# Patient Record
Sex: Male | Born: 1975 | Race: Black or African American | Hispanic: No | Marital: Single | Smoking: Current every day smoker
Health system: Southern US, Community
[De-identification: ages and names within clinical notes are randomized; demographics above are authoritative.]

## PROBLEM LIST (undated history)

## (undated) DIAGNOSIS — IMO0001 Reserved for inherently not codable concepts without codable children: Secondary | ICD-10-CM

## (undated) DIAGNOSIS — E785 Hyperlipidemia, unspecified: Secondary | ICD-10-CM

## (undated) DIAGNOSIS — K219 Gastro-esophageal reflux disease without esophagitis: Secondary | ICD-10-CM

## (undated) DIAGNOSIS — E119 Type 2 diabetes mellitus without complications: Secondary | ICD-10-CM

## (undated) DIAGNOSIS — I10 Essential (primary) hypertension: Secondary | ICD-10-CM

---

## 2009-02-23 HISTORY — PX: WISDOM TOOTH EXTRACTION: SHX21

## 2012-02-08 ENCOUNTER — Emergency Department (HOSPITAL_COMMUNITY)
Admission: EM | Admit: 2012-02-08 | Discharge: 2012-02-08 | Disposition: A | Discharge status: Home / Self Care | Payer: Self-pay | Attending: Emergency Medicine | Admitting: Emergency Medicine

## 2012-02-08 ENCOUNTER — Encounter (HOSPITAL_COMMUNITY): Payer: Self-pay | Admitting: *Deleted

## 2012-02-08 ENCOUNTER — Emergency Department (HOSPITAL_COMMUNITY): Payer: Self-pay

## 2012-02-08 DIAGNOSIS — J40 Bronchitis, not specified as acute or chronic: Secondary | ICD-10-CM | POA: Insufficient documentation

## 2012-02-08 DIAGNOSIS — J3489 Other specified disorders of nose and nasal sinuses: Secondary | ICD-10-CM | POA: Insufficient documentation

## 2012-02-08 DIAGNOSIS — R6883 Chills (without fever): Secondary | ICD-10-CM | POA: Insufficient documentation

## 2012-02-08 DIAGNOSIS — J069 Acute upper respiratory infection, unspecified: Secondary | ICD-10-CM | POA: Insufficient documentation

## 2012-02-08 DIAGNOSIS — IMO0001 Reserved for inherently not codable concepts without codable children: Secondary | ICD-10-CM | POA: Insufficient documentation

## 2012-02-08 DIAGNOSIS — K219 Gastro-esophageal reflux disease without esophagitis: Secondary | ICD-10-CM | POA: Insufficient documentation

## 2012-02-08 DIAGNOSIS — R079 Chest pain, unspecified: Secondary | ICD-10-CM | POA: Insufficient documentation

## 2012-02-08 DIAGNOSIS — F17200 Nicotine dependence, unspecified, uncomplicated: Secondary | ICD-10-CM | POA: Insufficient documentation

## 2012-02-08 HISTORY — DX: Reserved for inherently not codable concepts without codable children: IMO0001

## 2012-02-08 HISTORY — DX: Gastro-esophageal reflux disease without esophagitis: K21.9

## 2012-02-08 MED ORDER — PSEUDOEPHEDRINE HCL 60 MG PO TABS: ORAL_TABLET | ORAL | Status: DC

## 2012-02-08 MED ORDER — PROMETHAZINE-CODEINE 6.25-10 MG/5ML PO SYRP: 5.0000 mL | ORAL_SOLUTION | Freq: Four times a day (QID) | ORAL | Status: AC | PRN

## 2012-02-08 MED ORDER — DICLOFENAC SODIUM 75 MG PO TBEC: 75.0000 mg | DELAYED_RELEASE_TABLET | Freq: Two times a day (BID) | ORAL | Status: DC

## 2012-02-08 NOTE — ED Notes (Addendum)
Pt with chest pain only with coughing, and body aches since yesterday morning

## 2012-02-08 NOTE — ED Provider Notes (Signed)
History     CSN: [id number]  Arrival date & time 02/08/12  1648   First MD Initiated Contact with Patient 02/08/12 1716      Chief Complaint  Patient presents with  . Chest Pain  . Generalized Body Aches  . Cough    (Consider location/radiation/quality/duration/timing/severity/associated sxs/prior treatment) Patient is a 36 y.o. male presenting with cough. The history is provided by the patient.  Cough This is a new problem. The current episode started yesterday. The problem occurs constantly. The problem has been gradually worsening. The cough is non-productive. The maximum temperature recorded prior to his arrival was 100 to 100.9 F. Associated symptoms include chest pain, chills, headaches, rhinorrhea and myalgias. Pertinent negatives include no shortness of breath and no wheezing. He has tried nothing for the symptoms. He is a smoker. His past medical history is significant for bronchitis.    Past Medical History  Diagnosis Date  . Reflux     History reviewed. No pertinent past surgical history.  History reviewed. No pertinent family history.  History  Substance Use Topics  . Smoking status: Current Every Day Smoker -- 0.5 packs/day    Types: Cigarettes  . Smokeless tobacco: Not on file  . Alcohol Use: Yes      Review of Systems  Constitutional: Positive for chills. Negative for activity change.       All ROS Neg except as noted in HPI  HENT: Positive for rhinorrhea. Negative for nosebleeds and neck pain.   Eyes: Negative for photophobia and discharge.  Respiratory: Positive for cough. Negative for shortness of breath and wheezing.   Cardiovascular: Positive for chest pain. Negative for palpitations.  Gastrointestinal: Negative for abdominal pain and blood in stool.  Genitourinary: Negative for dysuria, frequency and hematuria.  Musculoskeletal: Positive for myalgias. Negative for back pain and arthralgias.  Skin: Negative.   Neurological: Positive for  headaches. Negative for dizziness, seizures and speech difficulty.  Psychiatric/Behavioral: Negative for hallucinations and confusion.    Allergies  Review of patient's allergies indicates no known allergies.  Home Medications  No current outpatient prescriptions on file.  BP 132/77  Pulse 112  Temp 100.3 F (37.9 C) (Oral)  Resp 20  Ht 5\' 8"  (1.727 m)  Wt 205 lb (92.987 kg)  BMI 31.17 kg/m2  SpO2 100%  Physical Exam  Nursing note and vitals reviewed. Constitutional: He is oriented to person, place, and time. He appears well-developed and well-nourished.  Non-toxic appearance.  HENT:  Head: Normocephalic.  Right Ear: Tympanic membrane and external ear normal.  Left Ear: Tympanic membrane and external ear normal.       Nasal congestion present  Eyes: EOM and lids are normal. Pupils are equal, round, and reactive to light.  Neck: Normal range of motion. Neck supple. Carotid bruit is not present.  Cardiovascular: Normal rate, regular rhythm, normal heart sounds, intact distal pulses and normal pulses.   Pulmonary/Chest: No respiratory distress. He exhibits tenderness.       There is symmetrical rise and fall of the chest. The patient has pain with deep breathing. Coarse breath sounds present, but no focal consolidation on limited exam.  Abdominal: Soft. Bowel sounds are normal. There is no tenderness. There is no guarding.  Musculoskeletal: Normal range of motion.  Lymphadenopathy:       Head (right side): No submandibular adenopathy present.       Head (left side): No submandibular adenopathy present.    He has no cervical adenopathy.  Neurological: He is  alert and oriented to person, place, and time. He has normal strength. No cranial nerve deficit or sensory deficit.  Skin: Skin is warm and dry.  Psychiatric: He has a normal mood and affect. His speech is normal.    ED Course  Procedures (including critical care time)  Labs Reviewed - No data to display No results  found. Pulse oximetry 100% on room air. Within normal limits by my interpretation.  No diagnosis found.    MDM  I have reviewed nursing notes, vital signs, and all appropriate lab and imaging results for this patient. The chest x-ray is negative for acute abnormality. The pulse oximetry is 100% on room air. The temperature is 100.3. Suspect patient has a bronchitis with an upper respiratory infection. Patient will be treated with promethazine cough medicine every 6 hours as needed, Sudafed 3 times daily, and Voltaren 2 times daily with food. Patient has been advised to increase fluids. And to see his primary physician or return to the emergency department if any changes, problems, or concerns.       Kathie Dike, [place] 02/08/12 2052

## 2012-02-08 NOTE — ED Notes (Signed)
Sick since yesterday, fever, cough, and pain in chest when coughs.

## 2012-02-09 NOTE — ED Provider Notes (Signed)
Medical screening examination/treatment/procedure(s) were performed by non-physician practitioner and as supervising physician I was immediately available for consultation/collaboration.   Adelai Achey, MD 02/09/12 0010 

## 2013-01-11 ENCOUNTER — Encounter (HOSPITAL_COMMUNITY): Payer: Self-pay | Admitting: Emergency Medicine

## 2013-01-11 ENCOUNTER — Emergency Department (HOSPITAL_COMMUNITY)
Admission: EM | Admit: 2013-01-11 | Discharge: 2013-01-11 | Disposition: A | Discharge status: Home / Self Care | Payer: BC Managed Care – PPO | Attending: Emergency Medicine | Admitting: Emergency Medicine

## 2013-01-11 DIAGNOSIS — Z8719 Personal history of other diseases of the digestive system: Secondary | ICD-10-CM | POA: Insufficient documentation

## 2013-01-11 DIAGNOSIS — Z79899 Other long term (current) drug therapy: Secondary | ICD-10-CM | POA: Insufficient documentation

## 2013-01-11 DIAGNOSIS — R42 Dizziness and giddiness: Secondary | ICD-10-CM | POA: Insufficient documentation

## 2013-01-11 DIAGNOSIS — I10 Essential (primary) hypertension: Secondary | ICD-10-CM | POA: Insufficient documentation

## 2013-01-11 DIAGNOSIS — F17200 Nicotine dependence, unspecified, uncomplicated: Secondary | ICD-10-CM | POA: Insufficient documentation

## 2013-01-11 HISTORY — DX: Gastro-esophageal reflux disease without esophagitis: K21.9

## 2013-01-11 HISTORY — DX: Essential (primary) hypertension: I10

## 2013-01-11 LAB — CBC WITH DIFFERENTIAL/PLATELET
Basophils Absolute: 0 10*3/uL (ref 0.0–0.1)
Basophils Relative: 1 % (ref 0–1)
Eosinophils Absolute: 0.2 10*3/uL (ref 0.0–0.7)
Eosinophils Relative: 3 % (ref 0–5)
HCT: 42.2 % (ref 39.0–52.0)
MCHC: 34.1 g/dL (ref 30.0–36.0)
Monocytes Absolute: 0.6 10*3/uL (ref 0.1–1.0)
Neutro Abs: 3.3 10*3/uL (ref 1.7–7.7)
RDW: 12.2 % (ref 11.5–15.5)

## 2013-01-11 LAB — BASIC METABOLIC PANEL
BUN: 14 mg/dL (ref 6–23)
Chloride: 96 mEq/L (ref 96–112)
Creatinine, Ser: 0.99 mg/dL (ref 0.50–1.35)
GFR calc Af Amer: >90 mL/min (ref >90)

## 2013-01-11 NOTE — ED Notes (Signed)
Patient reports has been having dizziness and near syncope x 2 weeks. Patient reports, "I passed out a couple times tonight at work."

## 2013-01-11 NOTE — ED Notes (Signed)
Pt was at work, felt like he was going in and out, did not pass out, light headed x 2 days. Felt like he might need more sleep, but worse tonight.

## 2013-01-11 NOTE — ED Provider Notes (Signed)
CSN: [id number]     Arrival date & time 01/11/13  2102 History   First MD Initiated Contact with Patient 01/11/13 2134     Chief Complaint  Patient presents with  . Near Syncope   (Consider location/radiation/quality/duration/timing/severity/associated sxs/prior Treatment) HPI Comments: Troy Prince is a 37 y.o. Male presenting with a 2 week history of intermittent feeling of "lightheadedness" like he imagines it would feel like to pass out.  He reports episodes where he is just standing at work, when he almost falls asleep, but then "jerks" himself awake.  He denies falls with these episodes.  He had several back to back episodes while standing at work tonight.  He denies fevers or chills, headache, visual changes,  Vertigo, palpitations, shortness of breath and chest pain.  He is treated for htn and was started on hctz about 3 weeks ago. He endorses drinking plenty of water while at work, so he doubts dehydration.  His girlfriend at the bedside states he snores quite heavy when sleeping on his back and has episodes of apnea until she wakes him, although states it is not as bad when he sleeps on his side.  He wakes most morning feeling unrested.  He also describes chronic nasal congestion and popping sensation in his ears.    The history is provided by the patient and the spouse.    Past Medical History  Diagnosis Date  . Reflux   . Hypertension   . GERD (gastroesophageal reflux disease)    History reviewed. No pertinent past surgical history. History reviewed. No pertinent family history. History  Substance Use Topics  . Smoking status: Current Every Day Smoker -- 0.50 packs/day    Types: Cigarettes  . Smokeless tobacco: Not on file  . Alcohol Use: Yes     Comment: occasional    Review of Systems  Constitutional: Negative for fever and diaphoresis.  HENT: Negative for congestion and sore throat.   Eyes: Negative.   Respiratory: Negative for chest tightness and shortness of  breath.   Cardiovascular: Negative for chest pain and palpitations.  Gastrointestinal: Negative for nausea and abdominal pain.  Genitourinary: Negative.   Musculoskeletal: Negative for arthralgias, joint swelling and neck pain.  Skin: Negative.  Negative for rash and wound.  Neurological: Positive for light-headedness. Negative for dizziness, weakness, numbness and headaches.  Psychiatric/Behavioral: Negative.     Allergies  Review of patient's allergies indicates no known allergies.  Home Medications   Current Outpatient Rx  Name  Route  Sig  Dispense  Refill  . hydrochlorothiazide (HYDRODIURIL) 25 MG tablet   Oral   Take 25 mg by mouth daily.         Marland Kitchen telmisartan (MICARDIS) 80 MG tablet   Oral   Take 80 mg by mouth daily.          BP 130/85  Pulse 80  Temp(Src) 98.1 F (36.7 C) (Oral)  Resp 16  Ht 5\' 8"  (1.727 m)  Wt 215 lb (97.523 kg)  BMI 32.70 kg/m2  SpO2 96% Physical Exam  Nursing note and vitals reviewed. Constitutional: He appears well-developed and well-nourished.  HENT:  Head: Normocephalic and atraumatic.  Eyes: Conjunctivae are normal.  Neck: Normal range of motion.  Cardiovascular: Normal rate, regular rhythm, normal heart sounds and intact distal pulses.   No murmur heard. Pulses:      Carotid pulses are 2+ on the right side, and 2+ on the left side. No carotid bruit.  Pulmonary/Chest: Effort normal and breath  sounds normal. He has no wheezes.  Abdominal: Soft. Bowel sounds are normal. There is no tenderness.  Musculoskeletal: Normal range of motion. He exhibits no edema.  Neurological: He is alert.  Skin: Skin is warm and dry.  Psychiatric: He has a normal mood and affect.    ED Course  Procedures (including critical care time) Labs Review Labs Reviewed  BASIC METABOLIC PANEL - Abnormal; Notable for the following:    CO2 33 (*)    Glucose, Bld 136 (*)    All other components within normal limits  CBC WITH DIFFERENTIAL - Abnormal;  Notable for the following:    Neutrophils Relative % 41 (*)    Lymphocytes Relative 49 (*)    All other components within normal limits   Imaging Review No results found.  EKG Interpretation    Date/Time:  Wednesday January 11 2013 21:07:45 EST Ventricular Rate:  85 PR Interval:  164 QRS Duration: 112 QT Interval:  368 QTC Calculation: 437 R Axis:   -7 Text Interpretation:  Normal sinus rhythm Normal ECG No previous ECGs available Confirmed by WENTZ  MD, ELLIOTT ([id number]) on 01/11/2013 9:41:36 PM            MDM   1. Episodic lightheadedness    Suspect possible sleep apnea, sx do not appear to be a near syncopal event, rather I feel he is drifting to sleep with these episodes.  Pt agrees with this possibility.  He was encouraged to f/u with pcp to discuss and arrange having a sleep study completed.  He agrees with this plan.  Patients labs and/or radiological studies were viewed and considered during the medical decision making and disposition process.     Burgess Amor, PA-C 01/12/13 0128

## 2013-01-12 NOTE — ED Provider Notes (Signed)
Medical screening examination/treatment/procedure(s) were performed by non-physician practitioner and as supervising physician I was immediately available for consultation/collaboration.  EKG Interpretation    Date/Time:  Wednesday January 11 2013 21:07:45 EST Ventricular Rate:  85 PR Interval:  164 QRS Duration: 112 QT Interval:  368 QTC Calculation: 437 R Axis:   -7 Text Interpretation:  Normal sinus rhythm Normal ECG No previous ECGs available Confirmed by Effie Shy  MD, Giovanni Biby ([id number]) on 01/11/2013 9:41:36 PM             Flint Melter, MD 01/12/13 1147

## 2013-08-09 DIAGNOSIS — Z79899 Other long term (current) drug therapy: Secondary | ICD-10-CM | POA: Insufficient documentation

## 2013-08-09 DIAGNOSIS — F17200 Nicotine dependence, unspecified, uncomplicated: Secondary | ICD-10-CM | POA: Insufficient documentation

## 2013-08-09 DIAGNOSIS — I10 Essential (primary) hypertension: Secondary | ICD-10-CM | POA: Insufficient documentation

## 2013-08-09 DIAGNOSIS — H938X9 Other specified disorders of ear, unspecified ear: Secondary | ICD-10-CM | POA: Insufficient documentation

## 2013-08-09 DIAGNOSIS — Z8719 Personal history of other diseases of the digestive system: Secondary | ICD-10-CM | POA: Insufficient documentation

## 2013-08-09 NOTE — ED Notes (Signed)
Patient reports dizziness x 3 days. States believes his blood pressure has been running high. States history of HTN, but hasn't taken medication in approximately a month.

## 2013-08-10 ENCOUNTER — Encounter (HOSPITAL_COMMUNITY): Payer: Self-pay | Admitting: Emergency Medicine

## 2013-08-10 ENCOUNTER — Emergency Department (HOSPITAL_COMMUNITY)
Admission: EM | Admit: 2013-08-10 | Discharge: 2013-08-10 | Disposition: A | Discharge status: Home / Self Care | Payer: BC Managed Care – PPO | Attending: Emergency Medicine | Admitting: Emergency Medicine

## 2013-08-10 DIAGNOSIS — H938X9 Other specified disorders of ear, unspecified ear: Secondary | ICD-10-CM

## 2013-08-10 DIAGNOSIS — I10 Essential (primary) hypertension: Secondary | ICD-10-CM

## 2013-08-10 MED ORDER — TELMISARTAN-HCTZ 80-25 MG PO TABS: 1.0000 | ORAL_TABLET | Freq: Every day | ORAL | Status: DC

## 2013-08-10 NOTE — Discharge Instructions (Signed)
If you were given medicines take as directed.  If you are on coumadin or contraceptives realize their levels and effectiveness is altered by many different medicines.  If you have any reaction (rash, tongues swelling, other) to the medicines stop taking and see a physician.   If you change your mind and would like further evaluation/blood work please return to the ER.  Arterial Hypertension Arterial hypertension (high blood pressure) is a condition of elevated pressure in your blood vessels. Hypertension over a long period of time is a risk factor for strokes, heart attacks, and heart failure. It is also the leading cause of kidney (renal) failure.  CAUSES   In Adults -- Over 90% of all hypertension has no known cause. This is called essential or primary hypertension. In the other 10% of people with hypertension, the increase in blood pressure is caused by another disorder. This is called secondary hypertension. Important causes of secondary hypertension are:  Heavy alcohol use.  Obstructive sleep apnea.  Hyperaldosterosim (Conn's syndrome).  Steroid use.  Chronic kidney failure.  Hyperparathyroidism.  Medications.  Renal artery stenosis.  Pheochromocytoma.  Cushing's disease.  Coarctation of the aorta.  Scleroderma renal crisis.  Licorice (in excessive amounts).  Drugs (cocaine, methamphetamine). Your caregiver can explain any items above that apply to you.  In Children -- Secondary hypertension is more common and should always be considered.  Pregnancy -- Few women of childbearing age have high blood pressure. However, up to 10% of them develop hypertension of pregnancy. Generally, this will not harm the woman. It may be a sign of 3 complications of pregnancy: preeclampsia, HELLP syndrome, and eclampsia. Follow up and control with medication is necessary. SYMPTOMS   This condition normally does not produce any noticeable symptoms. It is usually found during a routine  exam.  Malignant hypertension is a late problem of high blood pressure. It may have the following symptoms:  Headaches.  Blurred vision.  End-organ damage (this means your kidneys, heart, lungs, and other organs are being damaged).  Stressful situations can increase the blood pressure. If a person with normal blood pressure has their blood pressure go up while being seen by their caregiver, this is often termed "white coat hypertension." Its importance is not known. It may be related with eventually developing hypertension or complications of hypertension.  Hypertension is often confused with mental tension, stress, and anxiety. DIAGNOSIS  The diagnosis is made by 3 separate blood pressure measurements. They are taken at least 1 week apart from each other. If there is organ damage from hypertension, the diagnosis may be made without repeat measurements. Hypertension is usually identified by having blood pressure readings:  Above 140/90 mmHg measured in both arms, at 3 separate times, over a couple weeks.  Over 130/80 mmHg should be considered a risk factor and may require treatment in patients with diabetes. Blood pressure readings over 120/80 mmHg are called "pre-hypertension" even in non-diabetic patients. To get a true blood pressure measurement, use the following guidelines. Be aware of the factors that can alter blood pressure readings.  Take measurements at least 1 hour after caffeine.  Take measurements 30 minutes after smoking and without any stress. This is another reason to quit smoking  it raises your blood pressure.  Use a proper cuff size. Ask your caregiver if you are not sure about your cuff size.  Most home blood pressure cuffs are automatic. They will measure systolic and diastolic pressures. The systolic pressure is the pressure reading at the  start of sounds. Diastolic pressure is the pressure at which the sounds disappear. If you are elderly, measure pressures in  multiple postures. Try sitting, lying or standing.  Sit at rest for a minimum of 5 minutes before taking measurements.  You should not be on any medications like decongestants. These are found in many cold medications.  Record your blood pressure readings and review them with your caregiver. If you have hypertension:  Your caregiver may do tests to be sure you do not have secondary hypertension (see "causes" above).  Your caregiver may also look for signs of metabolic syndrome. This is also called Syndrome X or Insulin Resistance Syndrome. You may have this syndrome if you have type 2 diabetes, abdominal obesity, and abnormal blood lipids in addition to hypertension.  Your caregiver will take your medical and family history and perform a physical exam.  Diagnostic tests may include blood tests (for glucose, cholesterol, potassium, and kidney function), a urinalysis, or an EKG. Other tests may also be necessary depending on your condition. PREVENTION  There are important lifestyle issues that you can adopt to reduce your chance of developing hypertension:  Maintain a normal weight.  Limit the amount of salt (sodium) in your diet.  Exercise often.  Limit alcohol intake.  Get enough potassium in your diet. Discuss specific advice with your caregiver.  Follow a DASH diet (dietary approaches to stop hypertension). This diet is rich in fruits, vegetables, and low-fat dairy products, and avoids certain fats. PROGNOSIS  Essential hypertension cannot be cured. Lifestyle changes and medical treatment can lower blood pressure and reduce complications. The prognosis of secondary hypertension depends on the underlying cause. Many people whose hypertension is controlled with medicine or lifestyle changes can live a normal, healthy life.  RISKS AND COMPLICATIONS  While high blood pressure alone is not an illness, it often requires treatment due to its short- and long-term effects on many organs.  Hypertension increases your risk for:  CVAs or strokes (cerebrovascular accident).  Heart failure due to chronically high blood pressure (hypertensive cardiomyopathy).  Heart attack (myocardial infarction).  Damage to the retina (hypertensive retinopathy).  Kidney failure (hypertensive nephropathy). Your caregiver can explain list items above that apply to you. Treatment of hypertension can significantly reduce the risk of complications. TREATMENT   For overweight patients, weight loss and regular exercise are recommended. Physical fitness lowers blood pressure.  Mild hypertension is usually treated with diet and exercise. A diet rich in fruits and vegetables, fat-free dairy products, and foods low in fat and salt (sodium) can help lower blood pressure. Decreasing salt intake decreases blood pressure in a 1/3 of people.  Stop smoking if you are a smoker. The steps above are highly effective in reducing blood pressure. While these actions are easy to suggest, they are difficult to achieve. Most patients with moderate or severe hypertension end up requiring medications to bring their blood pressure down to a normal level. There are several classes of medications for treatment. Blood pressure pills (antihypertensives) will lower blood pressure by their different actions. Lowering the blood pressure by 10 mmHg may decrease the risk of complications by as much as 25%. The goal of treatment is effective blood pressure control. This will reduce your risk for complications. Your caregiver will help you determine the best treatment for you according to your lifestyle. What is excellent treatment for one person, may not be for you. HOME CARE INSTRUCTIONS   Do not smoke.  Follow the lifestyle changes outlined in  the "Prevention" section.  If you are on medications, follow the directions carefully. Blood pressure medications must be taken as prescribed. Skipping doses reduces their benefit. It also  puts you at risk for problems.  Follow up with your caregiver, as directed.  If you are asked to monitor your blood pressure at home, follow the guidelines in the "Diagnosis" section above. SEEK MEDICAL CARE IF:   You think you are having medication side effects.  You have recurrent headaches or lightheadedness.  You have swelling in your ankles.  You have trouble with your vision. SEEK IMMEDIATE MEDICAL CARE IF:   You have sudden onset of chest pain or pressure, difficulty breathing, or other symptoms of a heart attack.  You have a severe headache.  You have symptoms of a stroke (such as sudden weakness, difficulty speaking, difficulty walking). MAKE SURE YOU:   Understand these instructions.  Will watch your condition.  Will get help right away if you are not doing well or get worse. Document Released: 02/09/2005 Document Revised: 05/04/2011 Document Reviewed: 09/09/2006 [facility]ExitCare Patient Information 2014 [place].  Please follow up as directed and return to the ER or see a physician for new or worsening symptoms.  Thank you. Filed Vitals:   08/09/13 2359  BP: 156/93  Pulse: 68  Temp: 98.3 F (36.8 C)  TempSrc: Oral  Resp: 18  Height: 5\' 8"  (1.727 m)  Weight: 215 lb (97.523 kg)  SpO2: 100%

## 2013-08-10 NOTE — ED Provider Notes (Signed)
CSN: [id number]     Arrival date & time 08/09/13  2342 History  This chart was scribed for Enid SkeensJoshua M Manila Rommel, MD by Milly JakobJohn Lee Graves, ED Scribe. The patient was seen in room APA15/APA15. Patient's care was started at 12:29 AM.   Chief Complaint  Patient presents with  . Dizziness   The history is provided by the patient. No language interpreter was used.   HPI Comments: Troy Prince is a 38 y.o. male who presents to the Emergency Department complaining of intermittent dizzy spells onset 4 days ago, with associated high BP. He states his ears have been popping but denies ringing. Patient reports taking 80mg  Micardis once per day and requests this today. He states he had similar symptoms of ear popping and high BP with dizzy spells 1 year ago. He states his symptoms were relieved at that time with his BP medication. He denies chest pain, neck pain, neck stiffness, SOB, vision changes, hearing loss, numbness, weakness, LOC, and chest pain.   Past Medical History  Diagnosis Date  . Reflux   . Hypertension   . GERD (gastroesophageal reflux disease)    History reviewed. No pertinent past surgical history. History reviewed. No pertinent family history. History  Substance Use Topics  . Smoking status: Current Every Day Smoker -- 0.50 packs/day    Types: Cigarettes  . Smokeless tobacco: Not on file  . Alcohol Use: Yes     Comment: occasional    Review of Systems  HENT: Negative for tinnitus.        + ear popping bilaterally  Eyes: Negative for visual disturbance.  Respiratory: Negative for shortness of breath.   Genitourinary: Negative.   Musculoskeletal: Negative for neck pain and neck stiffness.  Neurological: Positive for dizziness. Negative for syncope, weakness and numbness.       + for head heaviness  All other systems reviewed and are negative.  Allergies  Review of patient's allergies indicates no known allergies.  Home Medications   Prior to Admission medications    Medication Sig Start Date End Date Taking? Authorizing Provider  hydrochlorothiazide (HYDRODIURIL) 25 MG tablet Take 25 mg by mouth daily.    Historical Provider, MD  telmisartan (MICARDIS) 80 MG tablet Take 80 mg by mouth daily.    Historical Provider, MD   BP 156/93  Pulse 68  Temp(Src) 98.3 F (36.8 C) (Oral)  Resp 18  Ht 5\' 8"  (1.727 m)  Wt 215 lb (97.523 kg)  BMI 32.70 kg/m2  SpO2 100% Physical Exam  Nursing note and vitals reviewed. Constitutional: He is oriented to person, place, and time. He appears well-developed and well-nourished. No distress.  HENT:  Head: Normocephalic and atraumatic.  Right Ear: Tympanic membrane normal.  Left Ear: Tympanic membrane normal.  No TM drainage bilaterally. Mild cerumen impaction bilaterally.   Eyes: Conjunctivae and EOM are normal. Pupils are equal, round, and reactive to light.  Visual fields intact.   Neck: Normal range of motion. Neck supple. No tracheal deviation present. No thyromegaly present.  No reproducible symptoms with movement of the head.  Cardiovascular: Normal rate, regular rhythm and normal heart sounds.   No murmur heard. Pulmonary/Chest: Effort normal and breath sounds normal. No respiratory distress. He has no wheezes. He has no rales.  Musculoskeletal: Normal range of motion.  Lymphadenopathy:    He has no cervical adenopathy.  Neurological: He is alert and oriented to person, place, and time. No cranial nerve deficit. He exhibits normal muscle tone. He displays  a negative Romberg sign. Coordination and gait normal.  No arm drift. Finger to nose intact bilaterally. Equal strength bilaterally. Sensation intact. 5+ strength in UE and LE with f/e at major joints. Sensation to palpation intact in UE and LE. CNs 2-12 grossly intact.  EOMFI.  PERRL.   Finger nose and coordination intact bilateral.   Visual fields intact to finger testing.   Skin: Skin is warm and dry.  Psychiatric: He has a normal mood and affect. His  behavior is normal.    ED Course  Procedures (including critical care time) DIAGNOSTIC STUDIES: Oxygen Saturation is 100% on room air, normal by my interpretation.    COORDINATION OF CARE: 12:35 AM-Discussed treatment plan with pt at bedside and pt agreed to plan.   Labs Review Labs Reviewed - No data to display  Imaging Review No results found.   EKG Interpretation None      MDM   Final diagnoses:  None   I personally performed the services described in this documentation, which was scribed in my presence. The recorded information has been reviewed and is accurate.  Patient presented with intermittent lightheaded and dizziness for 3 days and elevated blood pressure. Patient denies significant headache, chest pain or shortness of breath. Clinically patient has a normal cardiac, neuro and lung exam. Patient is well-appearing. I discussed screening blood work, EKG and further evaluation and patient refuses at this time. He feels this is similar to when his blood pressure is elevated multiple times the past and would like his blood pressure meds refilled and to followup with his doctor. He understands we may miss other diagnoses or extent of organ damage from his high blood pressure. Clinically he is well-appearing in no signs of endorgan damage, reasons to return given. I refilled his prescription for one month. Patient has capacity to make decisions.  Results and differential diagnosis were discussed with the patient/parent/guardian. Close follow up outpatient was discussed, comfortable with the plan.   Medications - No data to display  Filed Vitals:   08/09/13 2359  BP: 156/93  Pulse: 68  Temp: 98.3 F (36.8 C)  TempSrc: Oral  Resp: 18  Height: 5\' 8"  (1.727 m)  Weight: 215 lb (97.523 kg)  SpO2: 100%       Enid SkeensJoshua M Teola Felipe, MD 08/10/13 [phone]

## 2013-09-19 ENCOUNTER — Encounter (HOSPITAL_COMMUNITY): Payer: Self-pay | Admitting: Emergency Medicine

## 2013-09-19 ENCOUNTER — Emergency Department (HOSPITAL_COMMUNITY)
Admission: EM | Admit: 2013-09-19 | Discharge: 2013-09-19 | Disposition: A | Discharge status: Home / Self Care | Payer: BC Managed Care – PPO | Attending: Emergency Medicine | Admitting: Emergency Medicine

## 2013-09-19 DIAGNOSIS — Y990 Civilian activity done for income or pay: Secondary | ICD-10-CM | POA: Insufficient documentation

## 2013-09-19 DIAGNOSIS — Z4800 Encounter for change or removal of nonsurgical wound dressing: Secondary | ICD-10-CM | POA: Insufficient documentation

## 2013-09-19 DIAGNOSIS — F17200 Nicotine dependence, unspecified, uncomplicated: Secondary | ICD-10-CM | POA: Insufficient documentation

## 2013-09-19 DIAGNOSIS — Z760 Encounter for issue of repeat prescription: Secondary | ICD-10-CM

## 2013-09-19 DIAGNOSIS — T22119A Burn of first degree of unspecified forearm, initial encounter: Secondary | ICD-10-CM | POA: Insufficient documentation

## 2013-09-19 DIAGNOSIS — X131XXA Other contact with steam and other hot vapors, initial encounter: Secondary | ICD-10-CM

## 2013-09-19 DIAGNOSIS — T23029A Burn of unspecified degree of unspecified single finger (nail) except thumb, initial encounter: Secondary | ICD-10-CM | POA: Insufficient documentation

## 2013-09-19 DIAGNOSIS — Y9289 Other specified places as the place of occurrence of the external cause: Secondary | ICD-10-CM | POA: Insufficient documentation

## 2013-09-19 DIAGNOSIS — Y9389 Activity, other specified: Secondary | ICD-10-CM | POA: Insufficient documentation

## 2013-09-19 DIAGNOSIS — Z5189 Encounter for other specified aftercare: Secondary | ICD-10-CM

## 2013-09-19 DIAGNOSIS — X12XXXA Contact with other hot fluids, initial encounter: Secondary | ICD-10-CM | POA: Insufficient documentation

## 2013-09-19 DIAGNOSIS — Z23 Encounter for immunization: Secondary | ICD-10-CM | POA: Insufficient documentation

## 2013-09-19 MED ORDER — VALACYCLOVIR HCL 1 G PO TABS: 1000.0000 mg | ORAL_TABLET | Freq: Three times a day (TID) | ORAL | Status: AC

## 2013-09-19 MED ORDER — TELMISARTAN-HCTZ 80-25 MG PO TABS: 1.0000 | ORAL_TABLET | Freq: Every day | ORAL | Status: AC

## 2013-09-19 MED ORDER — TETANUS-DIPHTH-ACELL PERTUSSIS 5-2.5-18.5 LF-MCG/0.5 IM SUSP
0.5000 mL | Freq: Once | INTRAMUSCULAR | Status: AC
  Administered 2013-09-19: 0.5 mL via INTRAMUSCULAR
  Filled 2013-09-19: qty 0.5

## 2013-09-19 NOTE — ED Provider Notes (Signed)
CSN: [id number]     Arrival date & time 09/19/13  1021 History   First MD Initiated Contact with Patient 09/19/13 1104     Chief Complaint  Patient presents with  . Burn     (Consider location/radiation/quality/duration/timing/severity/associated sxs/prior Treatment) HPI Comments: The patient presents to the emergency department with complaint of a burn to the forearm. The patient states that 3 days ago while at work he had some very hot glue to splash on his forearm. The patient presents now for evaluation as he states that his employer wants him to have it checked. He has not had any high fevers from it. He has not had any red streaks noted. He's not had any drainage appreciated. The patient is nondiabetic. He has no medical problems that would compromise his immune system. He is unsure however of the time and date of his last tetanus.  The history is provided by the patient.    Past Medical History  Diagnosis Date  . Reflux   . Hypertension   . GERD (gastroesophageal reflux disease)    History reviewed. No pertinent past surgical history. No family history on file. History  Substance Use Topics  . Smoking status: Current Every Day Smoker -- 0.50 packs/day    Types: Cigarettes  . Smokeless tobacco: Not on file  . Alcohol Use: Yes     Comment: occasional    Review of Systems  Constitutional: Negative for activity change.       All ROS Neg except as noted in HPI  HENT: Negative for nosebleeds.   Eyes: Negative for photophobia and discharge.  Respiratory: Negative for cough, shortness of breath and wheezing.   Cardiovascular: Negative for chest pain and palpitations.  Gastrointestinal: Negative for abdominal pain and blood in stool.  Genitourinary: Negative for dysuria, frequency and hematuria.  Musculoskeletal: Negative for arthralgias, back pain and neck pain.  Skin: Negative.   Neurological: Negative for dizziness, seizures and speech difficulty.  Psychiatric/Behavioral:  Negative for hallucinations and confusion.      Allergies  Niacin and related  Home Medications   Prior to Admission medications   Medication Sig Start Date End Date Taking? Authorizing Provider  hydrochlorothiazide (HYDRODIURIL) 25 MG tablet Take 25 mg by mouth daily.   Yes Historical Provider, MD  telmisartan (MICARDIS) 80 MG tablet Take 80 mg by mouth daily.   Yes Historical Provider, MD   BP 147/93  Pulse 73  Temp(Src) 98.5 F (36.9 C) (Oral)  Resp 20  Ht 5\' 8"  (1.727 m)  Wt 210 lb (95.255 kg)  BMI 31.94 kg/m2  SpO2 99% Physical Exam  Nursing note and vitals reviewed. Constitutional: He is oriented to person, place, and time. He appears well-developed and well-nourished.  Non-toxic appearance.  HENT:  Head: Normocephalic.  Right Ear: Tympanic membrane and external ear normal.  Left Ear: Tympanic membrane and external ear normal.  Eyes: EOM and lids are normal. Pupils are equal, round, and reactive to light.  Neck: Normal range of motion. Neck supple. Carotid bruit is not present.  Cardiovascular: Normal rate, regular rhythm, normal heart sounds, intact distal pulses and normal pulses.   Pulmonary/Chest: Breath sounds normal. No respiratory distress.  Abdominal: Soft. Bowel sounds are normal. There is no tenderness. There is no guarding.  Musculoskeletal: Normal range of motion.  There is a 3.6 cm burn area of the inner aspect of the left forearm. There is granulation tissue being laid down. There is no red streaking appreciated. There is no drainage  appreciated. There is no satellite abscess appreciated. There is full range of motion of the fingers and wrists and elbow without problem.  Lymphadenopathy:       Head (right side): No submandibular adenopathy present.       Head (left side): No submandibular adenopathy present.    He has no cervical adenopathy.  Neurological: He is alert and oriented to person, place, and time. He has normal strength. No cranial nerve  deficit or sensory deficit.  Skin: Skin is warm and dry.  Psychiatric: He has a normal mood and affect. His speech is normal.    ED Course  Procedures (including critical care time) Labs Review Labs Reviewed - No data to display  Imaging Review No results found.   EKG Interpretation None      MDM Patient sustained a turned to the forearm from hot glue about 3 days ago. He presents to the emergency room at the request of his employer to have this evaluated. There no signs of advancing infection at this time. The wound shows granulation tissue being laid down.  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 Explained to the patient that this should be taken care of by his primary physician. The patient states that he is in the midst of changing physicians because his current physician we'll not renew his prescription. The prescriptions were refilled today with instructions that future prescriptions will need to be filled by his primary providers.    Final diagnoses:  None    **I have reviewed nursing notes, vital signs, and all appropriate lab and imaging results for this patient.Kathie Dike, PA-C 09/19/13 1119

## 2013-09-19 NOTE — ED Provider Notes (Signed)
Medical screening examination/treatment/procedure(s) were performed by non-physician practitioner and as supervising physician I was immediately available for consultation/collaboration.   EKG Interpretation None        Drina Jobst L Rayley Gao, MD 09/19/13 1525 

## 2013-09-19 NOTE — ED Notes (Signed)
Pt states that he was burned on left forearm area three days ago, and right index finger last night by a machine at work, advises that his employer is wanting him to be "checked out",

## 2013-09-19 NOTE — Discharge Instructions (Signed)
Your is progressing nicely. Please keep it covered until completely healed. Your prescriptions were refilled as a courtesy today. Please see your primary physician's for medication refills. Your tetanus status was updated today, please adjust your records. Medication Refill, Emergency Department We have refilled your medication today as a courtesy to you. It is best for your medical care, however, to take care of getting refills done through your primary caregiver's office. They have your records and can do a better job of follow-up than we can in the emergency department. On maintenance medications, we often only prescribe enough medications to get you by until you are able to see your regular caregiver. This is a more expensive way to refill medications. In the future, please plan for refills so that you will not have to use the emergency department for this. Thank you for your help. Your help allows us to better take care of the daily emergencies that enter our department. Document Released: 05/29/2003 Document Revised: 05/04/2011 Document Reviewed: 05/19/2013 [facility] Patient Information 2015 [employer] [place]. This information is not intended to replace advice given to you by your health care provider. Make sure you discuss any questions you have with your health care provider.

## 2014-11-26 ENCOUNTER — Encounter (HOSPITAL_COMMUNITY): Payer: Self-pay | Admitting: Emergency Medicine

## 2014-11-26 ENCOUNTER — Emergency Department (HOSPITAL_COMMUNITY)
Admission: EM | Admit: 2014-11-26 | Discharge: 2014-11-26 | Disposition: A | Discharge status: Home / Self Care | Payer: 59 | Attending: Emergency Medicine | Admitting: Emergency Medicine

## 2014-11-26 DIAGNOSIS — Z8719 Personal history of other diseases of the digestive system: Secondary | ICD-10-CM | POA: Diagnosis not present

## 2014-11-26 DIAGNOSIS — H5711 Ocular pain, right eye: Secondary | ICD-10-CM | POA: Diagnosis present

## 2014-11-26 DIAGNOSIS — I10 Essential (primary) hypertension: Secondary | ICD-10-CM | POA: Insufficient documentation

## 2014-11-26 DIAGNOSIS — Z79899 Other long term (current) drug therapy: Secondary | ICD-10-CM | POA: Insufficient documentation

## 2014-11-26 DIAGNOSIS — Z720 Tobacco use: Secondary | ICD-10-CM | POA: Diagnosis not present

## 2014-11-26 DIAGNOSIS — H109 Unspecified conjunctivitis: Secondary | ICD-10-CM | POA: Diagnosis not present

## 2014-11-26 MED ORDER — TOBRAMYCIN 0.3 % OP SOLN
2.0000 [drp] | Freq: Once | OPHTHALMIC | Status: AC
  Administered 2014-11-26: 2 [drp] via OPHTHALMIC
  Filled 2014-11-26: qty 5

## 2014-11-26 MED ORDER — INDOMETHACIN 25 MG PO CAPS
25.0000 mg | ORAL_CAPSULE | Freq: Once | ORAL | Status: AC
  Administered 2014-11-26: 25 mg via ORAL
  Filled 2014-11-26: qty 1

## 2014-11-26 MED ORDER — MELOXICAM 15 MG PO TABS: 15.0000 mg | ORAL_TABLET | Freq: Every day | ORAL | Status: DC

## 2014-11-26 NOTE — ED Provider Notes (Signed)
CSN: [id number]     Arrival date & time 11/26/14  1032 History  By signing my name below, I, Troy Prince, attest that this documentation has been prepared under the direction and in the presence of Ivery Quale, PA-C.  Electronically Signed: Gwenyth Prince, ED Scribe. 11/26/2014. 12:09 PM.  Chief Complaint  Patient presents with  . Eye Pain   HPI Comments: No hx of glaucoma.  Patient is a 39 y.o. male presenting with eye pain. The history is provided by the patient. No language interpreter was used.  Eye Pain This is a new problem. The current episode started 2 days ago. The problem occurs constantly. The problem has not changed since onset.Pertinent negatives include no headaches. Nothing aggravates the symptoms. Nothing relieves the symptoms. He has tried a cold compress for the symptoms. The treatment provided no relief.   HPI Comments: Troy Prince is a 39 y.o. male who presents to the Emergency Department complaining of constant, moderate left eye pain that started 2 days ago. Pt states swelling of his left eye as an associated symptom. He tried ice with no relief. Pt denies recent trauma to his eye. He also denies HA, drainage and photophobia.  Past Medical History  Diagnosis Date  . Reflux   . Hypertension   . GERD (gastroesophageal reflux disease)    History reviewed. No pertinent past surgical history. History reviewed. No pertinent family history. Social History  Substance Use Topics  . Smoking status: Current Every Day Smoker -- 0.50 packs/day    Types: Cigarettes  . Smokeless tobacco: None  . Alcohol Use: Yes     Comment: occasional   Review of Systems  Eyes: Positive for pain. Negative for photophobia and discharge.  Neurological: Negative for headaches.  All other systems reviewed and are negative.  Allergies  Niacin and related  Home Medications   Prior to Admission medications   Medication Sig Start Date End Date Taking? Authorizing Provider   hydrochlorothiazide (HYDRODIURIL) 25 MG tablet Take 25 mg by mouth daily.    Historical Provider, MD  telmisartan (MICARDIS) 80 MG tablet Take 80 mg by mouth daily.    Historical Provider, MD  telmisartan-hydrochlorothiazide (MICARDIS HCT) 80-25 MG per tablet Take 1 tablet by mouth daily. 09/19/13   Ivery Quale, PA-C   BP 139/91 mmHg  Pulse 73  Temp(Src) 98 F (36.7 C) (Oral)  Resp 16  Ht  (1.727 m)  Wt 220 lb (99.791 kg)  BMI 33.46 kg/m2  SpO2 100% Physical Exam  Constitutional: He appears well-developed and well-nourished. No distress.  HENT:  Head: Normocephalic and atraumatic.  Eyes: Conjunctivae and EOM are normal. Pupils are equal, round, and reactive to light.  Increased redness of the conjunctiva on the right extending into the vulva conjunctiva No red streaks No evidence for orbital cellulitis  Neck: Neck supple. No tracheal deviation present.  Cardiovascular: Normal rate, regular rhythm and normal heart sounds.   Pulmonary/Chest: Effort normal and breath sounds normal. No respiratory distress. He has no wheezes. He has no rales.  Lymphadenopathy:    He has no cervical adenopathy.  Skin: Skin is warm and dry.  Psychiatric: He has a normal mood and affect. His behavior is normal.  Nursing note and vitals reviewed.   ED Course  Procedures   DIAGNOSTIC STUDIES: Oxygen Saturation is 100% on RA, normal by my interpretation.    COORDINATION OF CARE: 11:56 AM Discussed treatment plan with pt which includes cold compresses, antibiotic drops and anti-inflammatories. Will  give work note. Pt agreed to plan.  MDM  The examination is consistent with conjunctivitis involving the lower lid. The patient will be treated with both tobramycin and accurately or and cool compresses. The patient will be referred to ophthalmology if not improving. I have discussed these findings and plan with the patient in terms which he understands. The patient is in agreement with this plan.     Final diagnoses:  None    *I have reviewed nursing notes, vital signs, and all appropriate lab and imaging results for this patient.**  **I personally performed the services described in this documentation, which was scribed in my presence. The recorded information has been reviewed and is accurate.Ivery Quale, PA-C 11/26/14 1224  Mancel Bale, MD 11/26/14 [phone]

## 2014-11-26 NOTE — Discharge Instructions (Signed)
Your examination is consistent with a conjunctivitis involving the lower lid. Please apply compresses several times daily. Please use to tobramycin eyedrops to the right eye every 4 hours for the next 5 days. Please use 2 drops of the acular 3 times daily for 5 days. Please see the eye specialist listed above if not improving. Conjunctivitis Conjunctivitis is commonly called "pink eye." Conjunctivitis can be caused by bacterial or viral infection, allergies, or injuries. There is usually redness of the lining of the eye, itching, discomfort, and sometimes discharge. There may be deposits of matter along the eyelids. A viral infection usually causes a watery discharge, while a bacterial infection causes a yellowish, thick discharge. Pink eye is very contagious and spreads by direct contact. You may be given antibiotic eyedrops as part of your treatment. Before using your eye medicine, remove all drainage from the eye by washing gently with warm water and cotton balls. Continue to use the medication until you have awakened 2 mornings in a row without discharge from the eye. Do not rub your eye. This increases the irritation and helps spread infection. Use separate towels from other household members. Wash your hands with soap and water before and after touching your eyes. Use cold compresses to reduce pain and sunglasses to relieve irritation from light. Do not wear contact lenses or wear eye makeup until the infection is gone. SEEK MEDICAL CARE IF:   Your symptoms are not better after 3 days of treatment.  You have increased pain or trouble seeing.  The outer eyelids become very red or swollen. Document Released: 03/19/2004 Document Revised: 05/04/2011 Document Reviewed: 02/09/2005 [facility] Patient Information 2015 [place], [place]. This information is not intended to replace advice given to you by your health care provider. Make sure you discuss any questions you have with your health care provider.

## 2014-11-26 NOTE — ED Notes (Signed)
Patient complaining of left eye pain and swelling x 2 days. Denies injury.

## 2015-10-25 ENCOUNTER — Other Ambulatory Visit: Payer: Self-pay | Admitting: Physical Medicine and Rehabilitation

## 2015-10-25 ENCOUNTER — Ambulatory Visit
Admission: RE | Admit: 2015-10-25 | Discharge: 2015-10-25 | Disposition: A | Discharge status: Home / Self Care | Payer: 59 | Source: Ambulatory Visit | Attending: Physical Medicine and Rehabilitation | Admitting: Physical Medicine and Rehabilitation

## 2015-10-25 DIAGNOSIS — Z0000 Encounter for general adult medical examination without abnormal findings: Secondary | ICD-10-CM

## 2016-03-06 ENCOUNTER — Emergency Department (HOSPITAL_COMMUNITY)
Admission: EM | Admit: 2016-03-06 | Discharge: 2016-03-06 | Disposition: A | Discharge status: Home / Self Care | Payer: 59 | Attending: Emergency Medicine | Admitting: Emergency Medicine

## 2016-03-06 ENCOUNTER — Encounter (HOSPITAL_COMMUNITY): Payer: Self-pay | Admitting: Emergency Medicine

## 2016-03-06 DIAGNOSIS — L02511 Cutaneous abscess of right hand: Secondary | ICD-10-CM

## 2016-03-06 DIAGNOSIS — I10 Essential (primary) hypertension: Secondary | ICD-10-CM | POA: Diagnosis not present

## 2016-03-06 DIAGNOSIS — F17210 Nicotine dependence, cigarettes, uncomplicated: Secondary | ICD-10-CM | POA: Diagnosis not present

## 2016-03-06 DIAGNOSIS — Z79899 Other long term (current) drug therapy: Secondary | ICD-10-CM | POA: Insufficient documentation

## 2016-03-06 DIAGNOSIS — M79644 Pain in right finger(s): Secondary | ICD-10-CM | POA: Diagnosis present

## 2016-03-06 MED ORDER — IBUPROFEN 800 MG PO TABS
800.0000 mg | ORAL_TABLET | Freq: Once | ORAL | Status: AC
  Administered 2016-03-06: 800 mg via ORAL
  Filled 2016-03-06: qty 1

## 2016-03-06 MED ORDER — HYDROCODONE-ACETAMINOPHEN 5-325 MG PO TABS: ORAL_TABLET | ORAL | 0 refills | Status: DC

## 2016-03-06 MED ORDER — CLINDAMYCIN HCL 300 MG PO CAPS: 300.0000 mg | ORAL_CAPSULE | Freq: Three times a day (TID) | ORAL | 0 refills | Status: DC

## 2016-03-06 MED ORDER — CLINDAMYCIN PHOSPHATE 600 MG/50ML IV SOLN
600.0000 mg | Freq: Once | INTRAVENOUS | Status: AC
  Administered 2016-03-06: 600 mg via INTRAVENOUS
  Filled 2016-03-06: qty 50

## 2016-03-06 MED ORDER — LIDOCAINE HCL (PF) 1 % IJ SOLN
INTRAMUSCULAR | Status: AC
  Administered 2016-03-06: 02:00:00
  Filled 2016-03-06: qty 5

## 2016-03-06 NOTE — ED Notes (Signed)
Pt states understanding of care given and follow up instructions.  Pt instructed to not drink ETOH or drive while taking disp medication

## 2016-03-06 NOTE — ED Provider Notes (Signed)
[facility]-EMERGENCY DEPT Provider Note   CSN: [id number] Arrival date & time: 03/06/16  0010     History   Chief Complaint Chief Complaint  Patient presents with  . Abscess    HPI Troy FavreLacy N Prince is a 41 y.o. male.  HPI   Troy FavreLacy N Kleppe is a 41 y.o. male who presents to the Emergency Department complaining of redness and pain to the right middle finger.  Symptoms present for two days.  He recalls and "small white bump" that popped and has increasing redness and pain to the finger with movement.  He states he feels radiating pain to his forearm with movement and difficulty bending the right middle finger.  He denies fever, chills, red streaks, or numbness.      Past Medical History:  Diagnosis Date  . GERD (gastroesophageal reflux disease)   . Hypertension   . Reflux     There are no active problems to display for this patient.   History reviewed. No pertinent surgical history.     Home Medications    Prior to Admission medications   Medication Sig Start Date End Date Taking? Authorizing Provider  telmisartan-hydrochlorothiazide (MICARDIS HCT) 80-25 MG per tablet Take 1 tablet by mouth daily. 09/19/13  Yes Ivery QualeHobson Bryant, PA-C  hydrochlorothiazide (HYDRODIURIL) 25 MG tablet Take 25 mg by mouth daily.    Historical Provider, MD  meloxicam (MOBIC) 15 MG tablet Take 1 tablet (15 mg total) by mouth daily. 11/26/14   Ivery QualeHobson Bryant, PA-C  telmisartan (MICARDIS) 80 MG tablet Take 80 mg by mouth daily.    Historical Provider, MD    Family History History reviewed. No pertinent family history.  Social History Social History  Substance Use Topics  . Smoking status: Current Every Day Smoker    Packs/day: 0.50    Types: Cigarettes  . Smokeless tobacco: Never Used  . Alcohol use Yes     Comment: twice a week     Allergies   Niacin and related   Review of Systems Review of Systems  Constitutional: Negative for chills and fever.  Musculoskeletal: Positive for arthralgias  and joint swelling.  Skin: Negative for color change and wound.  All other systems reviewed and are negative.    Physical Exam Updated Vital Signs BP 142/82 (BP Location: Left Arm)   Pulse 89   Temp 98.3 F (36.8 C) (Oral)   Resp 20   Ht 5\' 8"  (1.727 m)   Wt 106.6 kg   SpO2 97%   BMI 35.73 kg/m   Physical Exam  Constitutional: He is oriented to person, place, and time. He appears well-developed and well-nourished. No distress.  HENT:  Head: Normocephalic and atraumatic.  Cardiovascular: Normal rate, regular rhythm and normal heart sounds.   No murmur heard. Pulmonary/Chest: Effort normal and breath sounds normal. No respiratory distress.  Musculoskeletal: He exhibits tenderness.       Right hand: He exhibits tenderness and swelling. He exhibits normal two-point discrimination and normal capillary refill. Normal sensation noted. Normal strength noted.       Hands: Nickel sized area of erythema of the radial aspect of right middle finger.  No tenderness proximal to the MCP.  No red streaks of the hand.  Sensation intact.  Pain with flexion of the finger.    Neurological: He is alert and oriented to person, place, and time. He exhibits normal muscle tone. Coordination normal.  Skin: Skin is warm and dry.  Nursing note and vitals reviewed.  ED Treatments / Results  Labs (all labs ordered are listed, but only abnormal results are displayed) Labs Reviewed - No data to display  EKG  EKG Interpretation None       Radiology No results found.  Procedures Procedures (including critical care time)  INCISION AND DRAINAGE Performed by: Maxwell Caul. Consent: Verbal consent obtained. Risks and benefits: risks, benefits and alternatives were discussed Type: abscess  Body area: right middle finger  Anesthesia: local infiltration  Incision was made with a #11 scalpel.  Local anesthetic: lidocaine 1 % w/o epinephrine  Anesthetic total: 2 ml  Complexity:  complex Blunt dissection to break up loculations  Drainage: purulent  Drainage amount: moderate  Packing material: none  Patient tolerance: Patient tolerated the procedure well with no immediate complications.     Medications Ordered in ED Medications  clindamycin (CLEOCIN) IVPB 600 mg (0 mg Intravenous Stopped 03/06/16 0202)  ibuprofen (ADVIL,MOTRIN) tablet 800 mg (800 mg Oral Given 03/06/16 0125)  lidocaine (PF) (XYLOCAINE) 1 % injection (  Given by Other 03/06/16 0200)     Initial Impression / Assessment and Plan / ED Course  I have reviewed the triage vital signs and the nursing notes.  Pertinent labs & imaging results that were available during my care of the patient were reviewed by me and considered in my medical decision making (see chart for details).  Clinical Course     Pt with focal abscess to the radial aspect of the proximal middle finger.  NV intact.  No tendon involvement at present.  IV clinda given here.  I&D performed with moderate purulent drainage.  Pt is feeling better.  Will rx clinda and dispense vicodin pre pack for home.  Pt agrees to elevate, frequent warm wet compresses and ER return Friday afternoon for recheck.    Final Clinical Impressions(s) / ED Diagnoses   Final diagnoses:  Abscess of finger of right hand    New Prescriptions New Prescriptions   CLINDAMYCIN (CLEOCIN) 300 MG CAPSULE    Take 1 capsule (300 mg total) by mouth 3 (three) times daily.   HYDROCODONE-ACETAMINOPHEN (NORCO/VICODIN) 5-325 MG TABLET    Take one-two tabs po q 4-6 hrs prn pain     Pauline Aus, PA-C 03/06/16 0227    Layla Maw Ward, DO 03/06/16 1610

## 2016-03-06 NOTE — Discharge Instructions (Signed)
Elevate your hand.  Warm wet soaks or compresses to your finger.  Return here 03/06/16 (later this evening) for recheck.

## 2016-03-06 NOTE — ED Notes (Signed)
Pt presents with redness and swelling to finger on right hand. States same began on Tuesday and has gotten worse since that time. Denies any drainage or attempting to squeeze it. Also denies any known bites or injuries.

## 2016-03-06 NOTE — ED Triage Notes (Signed)
Pt states he woke up Tuesday morning with swelling and redness of the right middle finger.  Finger and hand have progressively become more swollen and painful

## 2016-03-10 MED FILL — Hydrocodone-Acetaminophen Tab 5-325 MG: ORAL | Qty: 6 | Status: AC

## 2017-07-31 ENCOUNTER — Observation Stay (HOSPITAL_COMMUNITY)
Admission: EM | Admit: 2017-07-31 | Discharge: 2017-08-01 | Disposition: A | Discharge status: Home / Self Care | Payer: 59 | Attending: Internal Medicine | Admitting: Internal Medicine

## 2017-07-31 ENCOUNTER — Emergency Department (HOSPITAL_COMMUNITY): Payer: 59

## 2017-07-31 ENCOUNTER — Encounter (HOSPITAL_COMMUNITY): Payer: Self-pay | Admitting: Emergency Medicine

## 2017-07-31 ENCOUNTER — Other Ambulatory Visit: Payer: Self-pay

## 2017-07-31 DIAGNOSIS — R079 Chest pain, unspecified: Secondary | ICD-10-CM

## 2017-07-31 DIAGNOSIS — E785 Hyperlipidemia, unspecified: Secondary | ICD-10-CM | POA: Diagnosis present

## 2017-07-31 DIAGNOSIS — I10 Essential (primary) hypertension: Secondary | ICD-10-CM | POA: Diagnosis present

## 2017-07-31 DIAGNOSIS — Z79899 Other long term (current) drug therapy: Secondary | ICD-10-CM | POA: Diagnosis not present

## 2017-07-31 DIAGNOSIS — F17210 Nicotine dependence, cigarettes, uncomplicated: Secondary | ICD-10-CM | POA: Diagnosis not present

## 2017-07-31 DIAGNOSIS — K219 Gastro-esophageal reflux disease without esophagitis: Secondary | ICD-10-CM | POA: Diagnosis present

## 2017-07-31 DIAGNOSIS — R0789 Other chest pain: Secondary | ICD-10-CM | POA: Diagnosis present

## 2017-07-31 DIAGNOSIS — R739 Hyperglycemia, unspecified: Secondary | ICD-10-CM | POA: Diagnosis present

## 2017-07-31 HISTORY — DX: Hyperlipidemia, unspecified: E78.5

## 2017-07-31 LAB — BASIC METABOLIC PANEL
Anion gap: 10 (ref 5–15)
BUN: 12 mg/dL (ref 6–20)
CALCIUM: 9.1 mg/dL (ref 8.9–10.3)
CO2: 25 mmol/L (ref 22–32)
CREATININE: 1.01 mg/dL (ref 0.61–1.24)
Chloride: 100 mmol/L — ABNORMAL LOW (ref 101–111)
GFR calc Af Amer: >60 mL/min (ref >60)
GLUCOSE: 186 mg/dL — AB (ref 65–99)
Potassium: 3.5 mmol/L (ref 3.5–5.1)
Sodium: 135 mmol/L (ref 135–145)

## 2017-07-31 LAB — CBC
HCT: 41.8 % (ref 39.0–52.0)
Hemoglobin: 14.2 g/dL (ref 13.0–17.0)
MCH: 32.1 pg (ref 26.0–34.0)
MCHC: 34 g/dL (ref 30.0–36.0)
MCV: 94.4 fL (ref 78.0–100.0)
Platelets: 171 10*3/uL (ref 150–400)
RBC: 4.43 MIL/uL (ref 4.22–5.81)
RDW: 12.5 % (ref 11.5–15.5)
WBC: 8.7 10*3/uL (ref 4.0–10.5)

## 2017-07-31 LAB — HEPATIC FUNCTION PANEL
ALT: 46 U/L (ref 17–63)
AST: 32 U/L (ref 15–41)
Albumin: 4.1 g/dL (ref 3.5–5.0)
Alkaline Phosphatase: 64 U/L (ref 38–126)
BILIRUBIN TOTAL: 0.6 mg/dL (ref 0.3–1.2)
Bilirubin, Direct: <0.1 mg/dL — ABNORMAL LOW (ref 0.1–0.5)
Total Protein: 7.1 g/dL (ref 6.5–8.1)

## 2017-07-31 LAB — LIPASE, BLOOD: Lipase: 70 U/L — ABNORMAL HIGH (ref 11–51)

## 2017-07-31 LAB — I-STAT TROPONIN, ED: TROPONIN I, POC: 0 ng/mL (ref 0.00–0.08)

## 2017-07-31 MED ORDER — ASPIRIN 81 MG PO CHEW
324.0000 mg | CHEWABLE_TABLET | Freq: Once | ORAL | Status: AC
  Administered 2017-07-31: 324 mg via ORAL
  Filled 2017-07-31: qty 4

## 2017-07-31 MED ORDER — GI COCKTAIL ~~LOC~~
30.0000 mL | Freq: Once | ORAL | Status: AC
  Administered 2017-07-31: 30 mL via ORAL
  Filled 2017-07-31: qty 30

## 2017-07-31 NOTE — ED Provider Notes (Signed)
[facility] EMERGENCY DEPARTMENT Provider Note   CSN: [id number] Arrival date & time: 07/31/17  2151     History   Chief Complaint Chief Complaint  Patient presents with  . Chest Pain    HPI Troy Prince is a 42 y.o. male.  Patient presents with intermittent central chest pain for the past 2 weeks.  Had an episode tonight while he was laying in bed that was in the center of his chest and radiated to both arms.  This lasted about 10 minutes.  He states it feels like "really bad heartburn".  He is on omeprazole for his heartburn and states compliance.  He has had this pain intermittently over the past 2 weeks that comes and goes.  It lasts for several minutes at a time.  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  He came in tonight because he decided to finally get it evaluated.  Chest pain is gone at this time.  It occasionally involves his upper back as well.  Denies any numbness or tingling in his fingers.  No shortness of breath, nausea, vomiting, diaphoresis or chills.  He has never had a stress test.  He does smoke cigarettes.  The history is provided by the patient.  Chest Pain   Pertinent negatives include no abdominal pain, no cough, no dizziness, no headaches, no nausea, no palpitations, no shortness of breath, no vomiting and no weakness.    Past Medical History:  Diagnosis Date  . GERD (gastroesophageal reflux disease)   . Hypertension   . Reflux     There are no active problems to display for this patient.   History reviewed. No pertinent surgical history.      Home Medications    Prior to Admission medications   Medication Sig Start Date End Date Taking? Authorizing Provider  atorvastatin (LIPITOR) 20 MG tablet Take 20 mg by mouth daily.   Yes [provider]  buPROPion (WELLBUTRIN) 75 MG tablet Take 75 mg by mouth 2 (two) times daily as needed (for smoking cessation).  05/02/17  Yes [provider]    omeprazole (PRILOSEC) 20 MG capsule Take 20 mg by mouth every morning.   Yes [provider]  telmisartan-hydrochlorothiazide (MICARDIS HCT) 80-25 MG per tablet Take 1 tablet by mouth daily. 09/19/13  Yes Ivery Quale, PA-C  valACYclovir (VALTREX) 1000 MG tablet Take 1 g by mouth daily. 07/21/17  Yes [provider]    Family History No family history on file.  Social History Social History   Tobacco Use  . Smoking status: Current Every Day Smoker    Packs/day: 0.50    Types: Cigarettes  . Smokeless tobacco: Never Used  Substance Use Topics  . Alcohol use: Yes    Comment: twice a week  . Drug use: No     Allergies   Niacin and related   Review of Systems Review of Systems  Constitutional: Negative for activity change and appetite change.  HENT: Negative for congestion and rhinorrhea.   Eyes: Negative for visual disturbance.  Respiratory: Positive for chest tightness. Negative for cough and shortness of breath.   Cardiovascular: Positive for chest pain. Negative for palpitations and leg swelling.  Gastrointestinal: Negative for abdominal pain, nausea and vomiting.  Genitourinary: Negative for dysuria, hematuria, testicular pain and urgency.  Musculoskeletal: Negative for arthralgias and myalgias.  Skin: Negative for rash.  Neurological: Negative for dizziness, weakness and headaches.   all other  systems are negative except as noted in the HPI and PMH.     Physical Exam Updated Vital Signs BP 136/89 (BP Location: Left Arm)   Pulse 84   Temp 98.9 F (37.2 C) (Oral)   Resp 20   SpO2 96%   Physical Exam  Constitutional: He is oriented to person, place, and time. He appears well-developed and well-nourished. No distress.  HENT:  Head: Normocephalic and atraumatic.  Mouth/Throat: Oropharynx is clear and moist. No oropharyngeal exudate.  Eyes: Pupils are equal, round, and reactive to light. Conjunctivae and EOM are normal.  Neck: Normal range of  motion. Neck supple.  No meningismus.  Cardiovascular: Normal rate, regular rhythm, normal heart sounds and intact distal pulses.  No murmur heard. Equal radial pulses and grip strengths  Pulmonary/Chest: Effort normal and breath sounds normal. No respiratory distress. He exhibits no tenderness.  Abdominal: Soft. There is no tenderness. There is no rebound and no guarding.  Musculoskeletal: Normal range of motion. He exhibits no edema or tenderness.  Neurological: He is alert and oriented to person, place, and time. No cranial nerve deficit. He exhibits normal muscle tone. Coordination normal.  No ataxia on finger to nose bilaterally. No pronator drift. 5/5 strength throughout. CN 2-12 intact.Equal grip strength. Sensation intact.   Skin: Skin is warm. Capillary refill takes less than 2 seconds. No rash noted.  Psychiatric: He has a normal mood and affect. His behavior is normal.  Nursing note and vitals reviewed.    ED Treatments / Results  Labs (all labs ordered are listed, but only abnormal results are displayed) Labs Reviewed  BASIC METABOLIC PANEL - Abnormal; Notable for the following components:      Result Value   Chloride 100 (*)    Glucose, Bld 186 (*)    All other components within normal limits  HEPATIC FUNCTION PANEL - Abnormal; Notable for the following components:   Bilirubin, Direct <0.1 (*)    All other components within normal limits  LIPASE, BLOOD - Abnormal; Notable for the following components:   Lipase 70 (*)    All other components within normal limits  CBC  D-DIMER, QUANTITATIVE (NOT AT [facility])  MAGNESIUM  PHOSPHORUS  HEMOGLOBIN A1C  TROPONIN I  TROPONIN I  HIV ANTIBODY (ROUTINE TESTING)  I-STAT TROPONIN, ED    EKG EKG Interpretation  Date/Time:  Saturday July 31 2017 22:01:35 EDT Ventricular Rate:  79 PR Interval:    QRS Duration: 110 QT Interval:  362 QTC Calculation: 415 R Axis:   71 Text Interpretation:  Sinus rhythm Borderline T  abnormalities, inferior leads Confirmed by Bethann BerkshireZammit, Joseph [phone]([id number]) on 07/31/2017 10:24:16 PM   Radiology Dg Chest 2 View  Result Date: 07/31/2017 CLINICAL DATA:  Acute onset of generalized chest pain and shortness of breath. Upper back pain. EXAM: CHEST - 2 VIEW COMPARISON:  Chest radiograph performed 10/25/2015 FINDINGS: The lungs are well-aerated and clear. There is no evidence of focal opacification, pleural effusion or pneumothorax. The heart is normal in size; the mediastinal contour is within normal limits. No acute osseous abnormalities are seen. IMPRESSION: No acute cardiopulmonary process seen. Electronically Signed   By: Roanna RaiderJeffery  Chang M.D.   On: 07/31/2017 23:02    Procedures Procedures (including critical care time)  Medications Ordered in ED Medications  gi cocktail (Maalox,Lidocaine,Donnatal) (has no administration in time range)  aspirin chewable tablet 324 mg (has no administration in time range)     Initial Impression / Assessment and Plan / ED Course  I have reviewed the triage vital signs and the nursing notes.  Pertinent labs & imaging results that were available during my care of the patient were reviewed by me and considered in my medical decision making (see chart for details).    Intermittent chest pain for the past 2 weeks that has atypical and typical features.  EKG shows inferior lateral ST depression somewhat more pronounced than previous. first troponin is negative.  Heart score is 4.  Low suspicion for PE or aortic dissection.  EKG does show new ST depression inferiorly.  First troponin is negative.  No further chest pain. D-dimer negative. LFTs and lipase normal.   Admission discussed with Dr. Robb Matar.  1 AM Patient has now decided that he wants to "go home and follow-up with his doctor on Monday".  Discussed with patient that this will be AGAINST MEDICAL ADVICE as ACS and heart attack have not been ruled out.  He understands he could be at risk for having a  heart attack and/or arrhythmia which could be life-threatening.  Patient appears to have capacity to make medical decisions and will leave AGAINST MEDICAL ADVICE.   Final Clinical Impressions(s) / ED Diagnoses   Final diagnoses:  Nonspecific chest pain    ED Discharge Orders    None       Manna Gose, Jeannett Senior, MD 08/01/17 [phone]

## 2017-07-31 NOTE — ED Triage Notes (Signed)
Pt C/O chest pain X 2 weeks. Pt states he was at work on Wednesday when the pain became so bad he had to stop working. Pt states the pain comes and goes throughout the day.

## 2017-07-31 NOTE — ED Notes (Signed)
Patient transported to X-ray 

## 2017-08-01 ENCOUNTER — Encounter (HOSPITAL_COMMUNITY): Payer: Self-pay | Admitting: Internal Medicine

## 2017-08-01 DIAGNOSIS — R739 Hyperglycemia, unspecified: Secondary | ICD-10-CM | POA: Diagnosis present

## 2017-08-01 DIAGNOSIS — K219 Gastro-esophageal reflux disease without esophagitis: Secondary | ICD-10-CM | POA: Diagnosis present

## 2017-08-01 DIAGNOSIS — I10 Essential (primary) hypertension: Secondary | ICD-10-CM | POA: Diagnosis present

## 2017-08-01 DIAGNOSIS — R079 Chest pain, unspecified: Secondary | ICD-10-CM | POA: Diagnosis present

## 2017-08-01 DIAGNOSIS — E785 Hyperlipidemia, unspecified: Secondary | ICD-10-CM | POA: Diagnosis present

## 2017-08-01 LAB — MAGNESIUM: MAGNESIUM: 1.8 mg/dL (ref 1.7–2.4)

## 2017-08-01 LAB — PHOSPHORUS: Phosphorus: 2.9 mg/dL (ref 2.5–4.6)

## 2017-08-01 LAB — HEMOGLOBIN A1C
Hgb A1c MFr Bld: 6.9 % — ABNORMAL HIGH (ref 4.8–5.6)
MEAN PLASMA GLUCOSE: 151.33 mg/dL

## 2017-08-01 LAB — D-DIMER, QUANTITATIVE (NOT AT ARMC)

## 2017-08-01 MED ORDER — ENOXAPARIN SODIUM 40 MG/0.4ML ~~LOC~~ SOLN: 40.0000 mg | SUBCUTANEOUS | Status: DC

## 2017-08-01 MED ORDER — ACETAMINOPHEN 325 MG PO TABS: 650.0000 mg | ORAL_TABLET | ORAL | Status: DC | PRN

## 2017-08-01 MED ORDER — ONDANSETRON HCL 4 MG/2ML IJ SOLN: 4.0000 mg | Freq: Four times a day (QID) | INTRAMUSCULAR | Status: DC | PRN

## 2017-08-01 MED ORDER — FAMOTIDINE IN NACL 20-0.9 MG/50ML-% IV SOLN: 20.0000 mg | Freq: Once | INTRAVENOUS | Status: DC

## 2017-08-01 MED ORDER — PANTOPRAZOLE SODIUM 40 MG PO TBEC: 40.0000 mg | DELAYED_RELEASE_TABLET | Freq: Once | ORAL | Status: DC

## 2017-08-01 MED ORDER — GI COCKTAIL ~~LOC~~: 30.0000 mL | Freq: Once | ORAL | Status: DC

## 2017-08-01 MED ORDER — ONDANSETRON HCL 4 MG PO TABS: 4.0000 mg | ORAL_TABLET | Freq: Four times a day (QID) | ORAL | Status: DC | PRN

## 2017-08-01 MED ORDER — ACETAMINOPHEN 650 MG RE SUPP: 650.0000 mg | Freq: Four times a day (QID) | RECTAL | Status: DC | PRN

## 2017-08-01 MED ORDER — ACETAMINOPHEN 325 MG PO TABS: 650.0000 mg | ORAL_TABLET | Freq: Four times a day (QID) | ORAL | Status: DC | PRN

## 2017-08-01 NOTE — ED Notes (Signed)
Pt chose to leave AMA. RN verbally advised Pt risk of death leaving without medical treatment. Pt verbalized understanding and chose to leave anyway.

## 2017-08-01 NOTE — Plan of Care (Signed)
Night shift hospitalist note.  The patient signed AMA from the ED before I evaluated him.  Sanda Kleinavid Ortiz, MD

## 2017-08-01 NOTE — Discharge Instructions (Addendum)
You are leaving AGAINST MEDICAL ADVICE.  Heart attack has not been ruled out.  You could be at risk for having a heart attack which can be life-threatening.  Return to the ED if you wish to be reevaluated.

## 2017-09-08 ENCOUNTER — Ambulatory Visit: Payer: 59 | Admitting: Cardiology

## 2017-09-08 NOTE — Progress Notes (Deleted)
     Clinical Summary [name] is a 42 y.o.male seen as new consult   1. Chest pain - seen in ER 07/31/17 with chest pain - D-dimer neg, trop neg x 1. Lipase 70. CXR no acute process. EKG with SR with inferior T-wave inversions - he refused admission -    Past Medical History:  Diagnosis Date  . GERD (gastroesophageal reflux disease)   . Hyperlipidemia   . Hypertension   . Reflux      Allergies  Allergen Reactions  . Niacin And Related     Burning with urination,      Current Outpatient Medications  Medication Sig Dispense Refill  . atorvastatin (LIPITOR) 20 MG tablet Take 20 mg by mouth daily.    Marland Kitchen. buPROPion (WELLBUTRIN) 75 MG tablet Take 75 mg by mouth 2 (two) times daily as needed (for smoking cessation).     Marland Kitchen. omeprazole (PRILOSEC) 20 MG capsule Take 20 mg by mouth every morning.    Marland Kitchen. telmisartan-hydrochlorothiazide (MICARDIS HCT) 80-25 MG per tablet Take 1 tablet by mouth daily. 30 tablet 0  . valACYclovir (VALTREX) 1000 MG tablet Take 1 g by mouth daily.  3   No current facility-administered medications for this visit.      No past surgical history on file.   Allergies  Allergen Reactions  . Niacin And Related     Burning with urination,       No family history on file.   Social History [name] reports that he has been smoking cigarettes.  He has been smoking about 0.50 packs per day. He has never used smokeless tobacco. [name] reports that he drinks alcohol.   Review of Systems CONSTITUTIONAL: No weight loss, fever, chills, weakness or fatigue.  HEENT: Eyes: No visual loss, blurred vision, double vision or yellow sclerae.No hearing loss, sneezing, congestion, runny nose or sore throat.  SKIN: No rash or itching.  CARDIOVASCULAR:  RESPIRATORY: No shortness of breath, cough or sputum.  GASTROINTESTINAL: No anorexia, nausea, vomiting or diarrhea. No abdominal pain or blood.  GENITOURINARY: No burning on urination, no  polyuria NEUROLOGICAL: No headache, dizziness, syncope, paralysis, ataxia, numbness or tingling in the extremities. No change in bowel or bladder control.  MUSCULOSKELETAL: No muscle, back pain, joint pain or stiffness.  LYMPHATICS: No enlarged nodes. No history of splenectomy.  PSYCHIATRIC: No history of depression or anxiety.  ENDOCRINOLOGIC: No reports of sweating, cold or heat intolerance. No polyuria or polydipsia.  Marland Kitchen.   Physical Examination There were no vitals filed for this visit. There were no vitals filed for this visit.  Gen: resting comfortably, no acute distress HEENT: no scleral icterus, pupils equal round and reactive, no palptable cervical adenopathy,  CV Resp: Clear to auscultation bilaterally GI: abdomen is soft, non-tender, non-distended, normal bowel sounds, no hepatosplenomegaly MSK: extremities are warm, no edema.  Skin: warm, no rash Neuro:  no focal deficits Psych: appropriate affect   Diagnostic Studies     Assessment and Plan        Troy Prince, M.D., F.A.C.C.

## 2017-10-15 ENCOUNTER — Ambulatory Visit: Payer: 59 | Admitting: Cardiology

## 2017-10-15 ENCOUNTER — Encounter: Payer: Self-pay | Admitting: Cardiology

## 2017-10-15 VITALS — BP 98/64 | HR 96 | Ht 68.0 in | Wt 263.0 lb

## 2017-10-15 DIAGNOSIS — R079 Chest pain, unspecified: Secondary | ICD-10-CM | POA: Diagnosis not present

## 2017-10-15 NOTE — Patient Instructions (Signed)
Medication Instructions:  Your physician recommends that you continue on your current medications as directed. Please refer to the Current Medication list given to you today.   Labwork: NONE  Testing/Procedures: Your physician has requested that you have a stress echocardiogram. For further information please visit [email or web address]. Please follow instruction sheet as given.    Follow-Up: Your physician recommends that you schedule a follow-up appointment in: PENDING TEST RESULT   Any Other Special Instructions Will Be Listed Below (If Applicable).     If you need a refill on your cardiac medications before your next appointment, please call your pharmacy.

## 2017-10-15 NOTE — Progress Notes (Signed)
Clinical Summary Mr. Verdi is a 42 y.o.male seen as new patient for chest pain.   1. Chest pain - ER visit 07/2017 with chest pain. Trop neg, D-Dimer neg. CXR no acute process. EKG SR inferior ST depressions - patient refused admission  - started week of June 8th. Burning pain midchest, lasts about 1.5 minutes at a time. Initially 8-9/10 in severity. Could occur at rest or with activity. No other associated symptoms. Not positional. Not related to eating - no exertional symptoms. - ongoing symptoms since onset, however milder and less frequent. Comes on with being upset. 2/10 in severity. Occurs about once a week now  CAD risk factors: HTN, HL, DM2 (new diagnosis based on 07/2017 labs), + tobacco, mother MI 78s.       SH: works at [employer] Past Medical History:  Diagnosis Date  . GERD (gastroesophageal reflux disease)   . Hyperlipidemia   . Hypertension   . Reflux      Allergies  Allergen Reactions  . Niacin And Related     Burning with urination,      Current Outpatient Medications  Medication Sig Dispense Refill  . atorvastatin (LIPITOR) 20 MG tablet Take 20 mg by mouth daily.    Marland Kitchen buPROPion (WELLBUTRIN) 75 MG tablet Take 75 mg by mouth 2 (two) times daily as needed (for smoking cessation).     Marland Kitchen omeprazole (PRILOSEC) 20 MG capsule Take 20 mg by mouth every morning.    Marland Kitchen telmisartan-hydrochlorothiazide (MICARDIS HCT) 80-25 MG per tablet Take 1 tablet by mouth daily. 30 tablet 0  . valACYclovir (VALTREX) 1000 MG tablet Take 1 g by mouth daily.  3   No current facility-administered medications for this visit.      No past surgical history on file.   Allergies  Allergen Reactions  . Niacin And Related     Burning with urination,       No family history on file.   Social History Mr. Regas reports that he has been smoking cigarettes. He has been smoking about 0.50 packs per day. He has never used smokeless  tobacco. Mr. Grundman reports that he drinks alcohol.   Review of Systems CONSTITUTIONAL: No weight loss, fever, chills, weakness or fatigue.  HEENT: Eyes: No visual loss, blurred vision, double vision or yellow sclerae.No hearing loss, sneezing, congestion, runny nose or sore throat.  SKIN: No rash or itching.  CARDIOVASCULAR: per hpi RESPIRATORY: No shortness of breath, cough or sputum.  GASTROINTESTINAL: No anorexia, nausea, vomiting or diarrhea. No abdominal pain or blood.  GENITOURINARY: No burning on urination, no polyuria NEUROLOGICAL: No headache, dizziness, syncope, paralysis, ataxia, numbness or tingling in the extremities. No change in bowel or bladder control.  MUSCULOSKELETAL: No muscle, back pain, joint pain or stiffness.  LYMPHATICS: No enlarged nodes. No history of splenectomy.  PSYCHIATRIC: No history of depression or anxiety.  ENDOCRINOLOGIC: No reports of sweating, cold or heat intolerance. No polyuria or polydipsia.  Marland Kitchen   Physical Examination Vitals:   10/15/17 0850 10/15/17 0858  BP: (!) 100/58 98/64  Pulse: 96   SpO2: 97%    Vitals:   10/15/17 0850  Weight: 263 lb (119.3 kg)  Height: 5\' 8"  (1.727 m)    Gen: resting comfortably, no acute distress HEENT: no scleral icterus, pupils equal round and reactive, no palptable cervical adenopathy,  CV: RRR, no m/r/g, no jvd Resp: Clear to auscultation bilaterally GI: abdomen is soft, non-tender, non-distended, normal bowel sounds,  no hepatosplenomegaly MSK: extremities are warm, no edema.  Skin: warm, no rash Neuro:  no focal deficits Psych: appropriate affect    Assessment and Plan  1. Chest pain - multiple CAD risk factors, though symptoms are somewhat mixed. EKG nonspecific ST/T changes - due to his baseline risk we will plan for an exercise stress echo to further evaluate   F/u pending stress results   Antoine PocheJonathan F. Kalab Camps, M.D.

## 2017-10-29 ENCOUNTER — Inpatient Hospital Stay (HOSPITAL_COMMUNITY): Admission: RE | Admit: 2017-10-29 | Payer: 59 | Source: Ambulatory Visit

## 2017-11-12 ENCOUNTER — Ambulatory Visit (HOSPITAL_COMMUNITY)
Admission: RE | Admit: 2017-11-12 | Discharge: 2017-11-12 | Disposition: A | Discharge status: Home / Self Care | Payer: 59 | Source: Ambulatory Visit | Attending: Cardiology | Admitting: Cardiology

## 2017-11-12 DIAGNOSIS — K219 Gastro-esophageal reflux disease without esophagitis: Secondary | ICD-10-CM | POA: Insufficient documentation

## 2017-11-12 DIAGNOSIS — E785 Hyperlipidemia, unspecified: Secondary | ICD-10-CM | POA: Insufficient documentation

## 2017-11-12 DIAGNOSIS — M25569 Pain in unspecified knee: Secondary | ICD-10-CM | POA: Insufficient documentation

## 2017-11-12 DIAGNOSIS — I10 Essential (primary) hypertension: Secondary | ICD-10-CM | POA: Insufficient documentation

## 2017-11-12 DIAGNOSIS — R079 Chest pain, unspecified: Secondary | ICD-10-CM | POA: Insufficient documentation

## 2017-11-12 LAB — ECHOCARDIOGRAM STRESS TEST
CHL CUP RESTING HR STRESS: 83 {beats}/min
CHL RATE OF PERCEIVED EXERTION: 11
CSEPED: 8 min
CSEPEW: 10.1 METS
CSEPPHR: 136 {beats}/min
Exercise duration (sec): 29 s
MPHR: 176 {beats}/min
Percent HR: 77 %

## 2017-11-12 NOTE — Progress Notes (Signed)
*  PRELIMINARY RESULTS* Echocardiogram Echocardiogram Stress Test has been performed.  Stacey DrainWhite, Nicolena Schurman J 11/12/2017, 10:22 AM

## 2017-11-17 ENCOUNTER — Telehealth: Payer: Self-pay

## 2017-11-17 DIAGNOSIS — R079 Chest pain, unspecified: Secondary | ICD-10-CM

## 2017-11-17 NOTE — Telephone Encounter (Signed)
-----   Message from Antoine PocheJonathan F Branch, MD sent at 11/15/2017  4:32 PM EDT ----- Stress test was limited due to inability to get his heart rate to the neccesary level. We will need to do a different type of stress test instead. Please order a lexiscan for chest pain. Does not need to hold any meds  Dominga FerryJ Branch MD

## 2017-11-17 NOTE — Telephone Encounter (Signed)
Called pt. No answer. Left message for pt to return call.  

## 2017-11-17 NOTE — Telephone Encounter (Signed)
-----   Message from Jonathan F Branch, MD sent at 11/15/2017  4:32 PM EDT ----- Stress test was limited due to inability to get his heart rate to the neccesary level. We will need to do a different type of stress test instead. Please order a lexiscan for chest pain. Does not need to hold any meds  J Branch MD 

## 2017-11-17 NOTE — Telephone Encounter (Signed)
Called pt. No answer. Left message for pt to return call. Placed order for Lexi.

## 2017-11-29 ENCOUNTER — Encounter (HOSPITAL_COMMUNITY): Payer: 59

## 2017-11-29 ENCOUNTER — Encounter (HOSPITAL_COMMUNITY)
Admission: RE | Admit: 2017-11-29 | Discharge: 2017-11-29 | Disposition: A | Discharge status: Home / Self Care | Payer: 59 | Source: Ambulatory Visit | Attending: Cardiology | Admitting: Cardiology

## 2017-11-29 ENCOUNTER — Encounter (HOSPITAL_COMMUNITY): Payer: Self-pay

## 2017-11-29 DIAGNOSIS — R079 Chest pain, unspecified: Secondary | ICD-10-CM

## 2017-11-29 LAB — NM MYOCAR MULTI W/SPECT W/WALL MOTION / EF
CHL CUP NUCLEAR SSS: 0
LHR: 0.44
LV dias vol: 123 mL (ref 62–150)
LV sys vol: 53 mL
Peak HR: 89 {beats}/min
Rest HR: 76 {beats}/min
SDS: 0
SRS: 0
TID: 1.28

## 2017-11-29 MED ORDER — TECHNETIUM TC 99M TETROFOSMIN IV KIT
30.0000 | PACK | Freq: Once | INTRAVENOUS | Status: AC | PRN
  Administered 2017-11-29: 33 via INTRAVENOUS

## 2017-11-29 MED ORDER — SODIUM CHLORIDE 0.9% FLUSH
INTRAVENOUS | Status: AC
  Administered 2017-11-29: 10 mL via INTRAVENOUS
  Filled 2017-11-29: qty 10

## 2017-11-29 MED ORDER — TECHNETIUM TC 99M TETROFOSMIN IV KIT
10.0000 | PACK | Freq: Once | INTRAVENOUS | Status: AC | PRN
  Administered 2017-11-29: 11 via INTRAVENOUS

## 2017-11-29 MED ORDER — REGADENOSON 0.4 MG/5ML IV SOLN
INTRAVENOUS | Status: AC
  Administered 2017-11-29: 0.4 mg via INTRAVENOUS
  Filled 2017-11-29: qty 5

## 2017-11-30 ENCOUNTER — Telehealth: Payer: Self-pay

## 2017-11-30 NOTE — Telephone Encounter (Signed)
-----   Message from Antoine Poche, MD sent at 11/30/2017 12:28 PM EDT ----- Stress test looks good, no evidence of any blockages. Needs to f/u with pcp to discuss noncardiac causes of his symptoms, f/u with [place] 4 months   Dominga Ferry MD

## 2017-11-30 NOTE — Telephone Encounter (Signed)
Called pt, No answer. Left message for pt to return call.  

## 2018-07-24 ENCOUNTER — Emergency Department (HOSPITAL_COMMUNITY)
Admission: EM | Admit: 2018-07-24 | Discharge: 2018-07-24 | Disposition: A | Discharge status: Home / Self Care | Payer: 59 | Attending: Emergency Medicine | Admitting: Emergency Medicine

## 2018-07-24 ENCOUNTER — Encounter (HOSPITAL_COMMUNITY): Payer: Self-pay

## 2018-07-24 ENCOUNTER — Other Ambulatory Visit: Payer: Self-pay

## 2018-07-24 DIAGNOSIS — Z79899 Other long term (current) drug therapy: Secondary | ICD-10-CM | POA: Insufficient documentation

## 2018-07-24 DIAGNOSIS — F17210 Nicotine dependence, cigarettes, uncomplicated: Secondary | ICD-10-CM | POA: Diagnosis not present

## 2018-07-24 DIAGNOSIS — Z7982 Long term (current) use of aspirin: Secondary | ICD-10-CM | POA: Diagnosis not present

## 2018-07-24 DIAGNOSIS — R739 Hyperglycemia, unspecified: Secondary | ICD-10-CM

## 2018-07-24 DIAGNOSIS — I10 Essential (primary) hypertension: Secondary | ICD-10-CM | POA: Insufficient documentation

## 2018-07-24 DIAGNOSIS — Z7984 Long term (current) use of oral hypoglycemic drugs: Secondary | ICD-10-CM | POA: Insufficient documentation

## 2018-07-24 DIAGNOSIS — E119 Type 2 diabetes mellitus without complications: Secondary | ICD-10-CM

## 2018-07-24 DIAGNOSIS — E1165 Type 2 diabetes mellitus with hyperglycemia: Secondary | ICD-10-CM | POA: Insufficient documentation

## 2018-07-24 HISTORY — DX: Type 2 diabetes mellitus without complications: E11.9

## 2018-07-24 LAB — CBC
HCT: 41.4 % (ref 39.0–52.0)
Hemoglobin: 14.9 g/dL (ref 13.0–17.0)
MCH: 32.3 pg (ref 26.0–34.0)
MCHC: 36 g/dL (ref 30.0–36.0)
MCV: 89.6 fL (ref 80.0–100.0)
Platelets: 162 10*3/uL (ref 150–400)
RBC: 4.62 MIL/uL (ref 4.22–5.81)
RDW: 12.1 % (ref 11.5–15.5)
WBC: 7.6 10*3/uL (ref 4.0–10.5)
nRBC: 0 % (ref 0.0–0.2)

## 2018-07-24 LAB — URINALYSIS, ROUTINE W REFLEX MICROSCOPIC
Bacteria, UA: NONE SEEN
Bilirubin Urine: NEGATIVE
Glucose, UA: >=500 mg/dL — AB
Ketones, ur: 5 mg/dL — AB
Leukocytes,Ua: NEGATIVE
Nitrite: NEGATIVE
Protein, ur: NEGATIVE mg/dL
Specific Gravity, Urine: 1.033 — ABNORMAL HIGH (ref 1.005–1.030)
pH: 6 (ref 5.0–8.0)

## 2018-07-24 LAB — BASIC METABOLIC PANEL
Anion gap: 11 (ref 5–15)
BUN: 17 mg/dL (ref 6–20)
CO2: 21 mmol/L — ABNORMAL LOW (ref 22–32)
Calcium: 8.5 mg/dL — ABNORMAL LOW (ref 8.9–10.3)
Chloride: 96 mmol/L — ABNORMAL LOW (ref 98–111)
Creatinine, Ser: 1.54 mg/dL — ABNORMAL HIGH (ref 0.61–1.24)
GFR calc Af Amer: >60 mL/min (ref >60)
GFR calc non Af Amer: 54 mL/min — ABNORMAL LOW (ref >60)
Glucose, Bld: 561 mg/dL (ref 70–99)
Potassium: 4.2 mmol/L (ref 3.5–5.1)
Sodium: 128 mmol/L — ABNORMAL LOW (ref 135–145)

## 2018-07-24 LAB — CBG MONITORING, ED
Glucose-Capillary: >600 mg/dL (ref 70–99)
Glucose-Capillary: 353 mg/dL — ABNORMAL HIGH (ref 70–99)

## 2018-07-24 MED ORDER — METFORMIN HCL 500 MG PO TABS: 500.0000 mg | ORAL_TABLET | Freq: Two times a day (BID) | ORAL | 0 refills | Status: AC

## 2018-07-24 MED ORDER — METFORMIN HCL 500 MG PO TABS
500.0000 mg | ORAL_TABLET | Freq: Once | ORAL | Status: AC
  Administered 2018-07-24: 500 mg via ORAL
  Filled 2018-07-24: qty 1

## 2018-07-24 MED ORDER — SODIUM CHLORIDE 0.9 % IV BOLUS
1000.0000 mL | Freq: Once | INTRAVENOUS | Status: AC
  Administered 2018-07-24: 19:00:00 1000 mL via INTRAVENOUS

## 2018-07-24 MED ORDER — INSULIN ASPART 100 UNIT/ML ~~LOC~~ SOLN
12.0000 [IU] | Freq: Once | SUBCUTANEOUS | Status: AC
  Administered 2018-07-24: 19:00:00 12 [IU] via SUBCUTANEOUS
  Filled 2018-07-24: qty 1

## 2018-07-24 MED ORDER — SODIUM CHLORIDE 0.9 % IV BOLUS: 500.0000 mL | Freq: Once | INTRAVENOUS | Status: DC

## 2018-07-24 MED ORDER — SODIUM CHLORIDE 0.9 % IV BOLUS
1000.0000 mL | Freq: Once | INTRAVENOUS | Status: AC
  Administered 2018-07-24: 21:00:00 1000 mL via INTRAVENOUS

## 2018-07-24 NOTE — ED Provider Notes (Addendum)
[facility] EMERGENCY DEPARTMENT Provider Note   CSN: [id number] Arrival date & time: 07/24/18  1191    History   Chief Complaint Chief Complaint  Patient presents with  . Hyperglycemia    HPI Troy Prince is a 43 y.o. male.     Patient c/o polyuria, polydipsia, and blurry vision for the past several days. Symptoms acute onset, moderate, persistent, without specific exacerbating or alleviating factors. Hx borderline dm, no prior use of diabetes meds. +fam hx dm. Denies fever or chills. No cough or uri symptoms. No recent wt loss or gain. Denies headaches. No chest pain or sob. No abd pain or nvd. No dysuria or gu c/o.   The history is provided by the patient.  Hyperglycemia  Associated symptoms: increased thirst and polyuria   Associated symptoms: no abdominal pain, no chest pain, no confusion, no dysuria, no fever, no shortness of breath and no vomiting     Past Medical History:  Diagnosis Date  . Diabetes mellitus without complication (HCC)   . GERD (gastroesophageal reflux disease)   . Hyperlipidemia   . Hypertension   . Reflux     Patient Active Problem List   Diagnosis Date Noted  . Chest pain 08/01/2017  . Hypertension 08/01/2017  . GERD (gastroesophageal reflux disease) 08/01/2017  . Hyperlipidemia 08/01/2017  . Hyperglycemia 08/01/2017    Past Surgical History:  Procedure Laterality Date  . WISDOM TOOTH EXTRACTION  2011        Home Medications    Prior to Admission medications   Medication Sig Start Date End Date Taking? Authorizing Provider  aspirin EC 81 MG tablet Take 81 mg by mouth as needed.    [provider]  atorvastatin (LIPITOR) 20 MG tablet Take 20 mg by mouth daily.    [provider]  omeprazole (PRILOSEC) 20 MG capsule Take 20 mg by mouth every morning.    [provider]  telmisartan-hydrochlorothiazide (MICARDIS HCT) 80-25 MG per tablet Take 1 tablet by mouth daily. 09/19/13   Ivery Quale, PA-C   valACYclovir (VALTREX) 1000 MG tablet Take 1 g by mouth daily. 07/21/17   [provider]    Family History Family History  Problem Relation Age of Onset  . Stroke Mother   . Lung cancer Father   . Stroke Father     Social History Social History   Tobacco Use  . Smoking status: Current Every Day Smoker    Packs/day: 0.50    Types: Cigarettes  . Smokeless tobacco: Never Used  Substance Use Topics  . Alcohol use: Yes    Comment: twice a week  . Drug use: No     Allergies   Niacin and related   Review of Systems Review of Systems  Constitutional: Negative for fever.  HENT: Negative for sore throat.   Eyes: Negative for redness.  Respiratory: Negative for cough and shortness of breath.   Cardiovascular: Negative for chest pain and leg swelling.  Gastrointestinal: Negative for abdominal pain, diarrhea and vomiting.  Endocrine: Positive for polydipsia and polyuria.  Genitourinary: Negative for dysuria and flank pain.  Musculoskeletal: Negative for back pain and neck pain.  Skin: Negative for rash.  Neurological: Negative for headaches.  Hematological: Does not bruise/bleed easily.  Psychiatric/Behavioral: Negative for confusion.     Physical Exam Updated Vital Signs BP (!) 135/91 (BP Location: Right Arm)   Pulse 91   Temp 98.2 F (36.8 C) (Oral)   Resp 18   Ht 1.727 m (  5\' 8" )   Wt 113.4 kg   SpO2 98%   BMI 38.01 kg/m   Physical Exam Vitals signs and nursing note reviewed.  Constitutional:      Appearance: Normal appearance. He is well-developed.  HENT:     Head: Atraumatic.     Nose: Nose normal.     Mouth/Throat:     Mouth: Mucous membranes are moist.     Pharynx: Oropharynx is clear.  Eyes:     General: No scleral icterus.    Conjunctiva/sclera: Conjunctivae normal.     Pupils: Pupils are equal, round, and reactive to light.  Neck:     Musculoskeletal: Normal range of motion and neck supple. No neck rigidity.     Trachea: No tracheal  deviation.  Cardiovascular:     Rate and Rhythm: Normal rate and regular rhythm.     Pulses: Normal pulses.     Heart sounds: Normal heart sounds. No murmur. No friction rub. No gallop.   Pulmonary:     Effort: Pulmonary effort is normal. No accessory muscle usage or respiratory distress.     Breath sounds: Normal breath sounds.  Abdominal:     General: Bowel sounds are normal. There is no distension.     Palpations: Abdomen is soft.     Tenderness: There is no abdominal tenderness. There is no guarding.  Genitourinary:    Comments: No cva tenderness. Musculoskeletal:        General: No swelling.  Skin:    General: Skin is warm and dry.     Findings: No rash.  Neurological:     Mental Status: He is alert.     Comments: Alert, speech clear. Steady gait.   Psychiatric:        Mood and Affect: Mood normal.      ED Treatments / Results  Labs (all labs ordered are listed, but only abnormal results are displayed) Results for orders placed or performed during the hospital encounter of 07/24/18  Basic metabolic panel  Result Value Ref Range   Sodium 128 (L) 135 - 145 mmol/L   Potassium 4.2 3.5 - 5.1 mmol/L   Chloride 96 (L) 98 - 111 mmol/L   CO2 21 (L) 22 - 32 mmol/L   Glucose, Bld 561 (HH) 70 - 99 mg/dL   BUN 17 6 - 20 mg/dL   Creatinine, Ser 6.38 (H) 0.61 - 1.24 mg/dL   Calcium 8.5 (L) 8.9 - 10.3 mg/dL   GFR calc non Af Amer 54 (L) >60 mL/min   GFR calc Af Amer >60 >60 mL/min   Anion gap 11 5 - 15  CBC  Result Value Ref Range   WBC 7.6 4.0 - 10.5 K/uL   RBC 4.62 4.22 - 5.81 MIL/uL   Hemoglobin 14.9 13.0 - 17.0 g/dL   HCT 93.7 34.2 - 87.6 %   MCV 89.6 80.0 - 100.0 fL   MCH 32.3 26.0 - 34.0 pg   MCHC 36.0 30.0 - 36.0 g/dL   RDW 81.1 57.2 - 62.0 %   Platelets 162 150 - 400 K/uL   nRBC 0.0 0.0 - 0.2 %  Urinalysis, Routine w reflex microscopic  Result Value Ref Range   Color, Urine STRAW (A) YELLOW   APPearance CLEAR CLEAR   Specific Gravity, Urine 1.033 (H) 1.005 -  1.030   pH 6.0 5.0 - 8.0   Glucose, UA >=500 (A) NEGATIVE mg/dL   Hgb urine dipstick SMALL (A) NEGATIVE   Bilirubin Urine NEGATIVE NEGATIVE  Ketones, ur 5 (A) NEGATIVE mg/dL   Protein, ur NEGATIVE NEGATIVE mg/dL   Nitrite NEGATIVE NEGATIVE   Leukocytes,Ua NEGATIVE NEGATIVE   RBC / HPF 0-5 0 - 5 RBC/hpf   Bacteria, UA NONE SEEN NONE SEEN   Squamous Epithelial / LPF 0-5 0 - 5  CBG monitoring, ED  Result Value Ref Range   Glucose-Capillary >600 (HH) 70 - 99 mg/dL  CBG monitoring, ED  Result Value Ref Range   Glucose-Capillary 353 (H) 70 - 99 mg/dL   EKG None  Radiology No results found.  Procedures Procedures (including critical care time)  Medications Ordered in ED Medications  sodium chloride 0.9 % bolus 1,000 mL (has no administration in time range)  insulin aspart (novoLOG) injection 12 Units (has no administration in time range)     Initial Impression / Assessment and Plan / ED Course  I have reviewed the triage vital signs and the nursing notes.  Pertinent labs & imaging results that were available during my care of the patient were reviewed by me and considered in my medical decision making (see chart for details).  CBG > 600. Iv ns bolus. novolog sq.   Reviewed nursing notes and prior charts for additional history.   Additional ns bolus.  2nd liter.   Await labs.   Labs reviewed by me - glucose high, 560s. Additional ivf.   Recheck cbg.   Pt indicates has pcp f/u tomorrow.   Pt refuses further ivf, or further eval/tx, and requests d/c, states he will f/u pcp in AM.   Glucose is improved, 300's, tolerating pol no nv. Pt continues to request d/c.   Will give rx metformin, diabetic diet. F/u in AM with pcp to discuss additional diabetes medication/hyoglycemic agent of their choice, and additional diabetic teaching.  CRITICAL CARE: Re severe hyperglycemia requiring insulin therapy and multiple IVF boluses Performed by: Suzi RootsKevin E Alberto Pina Total critical care  time: 40 minutes Critical care time was exclusive of separately billable procedures and treating other patients. Critical care was necessary to treat or prevent imminent or life-threatening deterioration. Critical care was time spent personally by me on the following activities: development of treatment plan with patient and/or surrogate as well as nursing, discussions with consultants, evaluation of patient's response to treatment, examination of patient, obtaining history from patient or surrogate, ordering and performing treatments and interventions, ordering and review of laboratory studies, ordering and review of radiographic studies, pulse oximetry and re-evaluation of patient's condition.   Final Clinical Impressions(s) / ED Diagnoses   Final diagnoses:  None    ED Discharge Orders    None           Cathren LaineSteinl, Josey Dettmann, MD 08/26/18 1446

## 2018-07-24 NOTE — ED Triage Notes (Addendum)
Pt presents to ED, states he was told he is borderline diabetic and checked his BS at home at it read 600. Pt states he has been having some blurred vision today. Pt denies N/V. Pt c/o dry mouth and frequent urination.

## 2018-07-24 NOTE — ED Notes (Signed)
Date and time results received: 07/24/18 1956   Test: CBG Critical Value: 561  Name of Provider Notified: Denton Lank  Orders Received? Or Actions Taken?: n/a

## 2018-07-24 NOTE — Discharge Instructions (Addendum)
It was our pleasure to provide your ER care today - we hope that you feel better.  Drink plenty of fluids/water, follow diabetic eating plan, and take metformin as prescribed. Follow up with your primary care doctor tomorrow AM as planned - discussed blood glucose testing and supplies, diabetic teaching, and possible additional diabetic medication then.   Return to ER if worse, new symptoms, fevers, persistent vomiting, weak/fainting, other concern.

## 2019-07-14 IMAGING — NM NM MYOCAR MULTI W/SPECT W/WALL MOTION & EF
2 series · 12 of 12 positions shown · non-contrast
Comparison: none

[Series 1: rest · 6.51mm/px · 6 of 64 frames shown]
[frame 6/64]
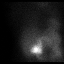
[frame 16/64]
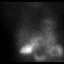
[frame 27/64]
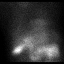
[frame 38/64]
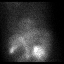
[frame 48/64]
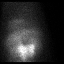
[frame 59/64]
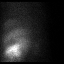

[Series 3: stress gated - perfusion · 6.51mm/px · 6 of 64 frames shown]
[frame 6/64]
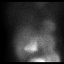
[frame 16/64]
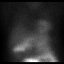
[frame 27/64]
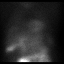
[frame 38/64]
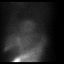
[frame 48/64]
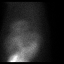
[frame 59/64]
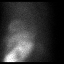

[12 of 12 positions shown; findings below may reference images not displayed]

Canned report from images found in remote index.

Refer to host system for actual result text.
